# Patient Record
Sex: Male | Born: 1970 | Race: White | Hispanic: No | Marital: Single | Smoking: Never smoker
Health system: Southern US, Community
[De-identification: ages and names within clinical notes are randomized; demographics above are authoritative.]

## PROBLEM LIST (undated history)

## (undated) DIAGNOSIS — R569 Unspecified convulsions: Secondary | ICD-10-CM

## (undated) HISTORY — PX: HERNIA REPAIR: SHX51

---

## 2004-06-02 ENCOUNTER — Emergency Department (HOSPITAL_COMMUNITY): Admission: EM | Admit: 2004-06-02 | Discharge: 2004-06-02 | Payer: Self-pay | Admitting: Emergency Medicine

## 2004-08-31 ENCOUNTER — Emergency Department (HOSPITAL_COMMUNITY): Admission: EM | Admit: 2004-08-31 | Discharge: 2004-08-31 | Payer: Self-pay | Admitting: Family Medicine

## 2011-04-17 ENCOUNTER — Emergency Department (HOSPITAL_COMMUNITY)
Admission: EM | Admit: 2011-04-17 | Discharge: 2011-04-17 | Disposition: A | Discharge status: Home / Self Care | Payer: 59 | Attending: Emergency Medicine | Admitting: Emergency Medicine

## 2011-04-17 ENCOUNTER — Encounter (HOSPITAL_COMMUNITY): Payer: Self-pay

## 2011-04-17 DIAGNOSIS — G40909 Epilepsy, unspecified, not intractable, without status epilepticus: Secondary | ICD-10-CM | POA: Insufficient documentation

## 2011-04-17 DIAGNOSIS — S0003XA Contusion of scalp, initial encounter: Secondary | ICD-10-CM | POA: Insufficient documentation

## 2011-04-17 DIAGNOSIS — R51 Headache: Secondary | ICD-10-CM | POA: Insufficient documentation

## 2011-04-17 DIAGNOSIS — F29 Unspecified psychosis not due to a substance or known physiological condition: Secondary | ICD-10-CM | POA: Insufficient documentation

## 2011-04-17 DIAGNOSIS — S1093XA Contusion of unspecified part of neck, initial encounter: Secondary | ICD-10-CM | POA: Insufficient documentation

## 2011-04-17 DIAGNOSIS — W503XXA Accidental bite by another person, initial encounter: Secondary | ICD-10-CM | POA: Insufficient documentation

## 2011-04-17 DIAGNOSIS — Z79899 Other long term (current) drug therapy: Secondary | ICD-10-CM | POA: Insufficient documentation

## 2011-04-17 DIAGNOSIS — R569 Unspecified convulsions: Secondary | ICD-10-CM

## 2011-04-17 DIAGNOSIS — S0180XA Unspecified open wound of other part of head, initial encounter: Secondary | ICD-10-CM | POA: Insufficient documentation

## 2011-04-17 DIAGNOSIS — W06XXXA Fall from bed, initial encounter: Secondary | ICD-10-CM | POA: Insufficient documentation

## 2011-04-17 DIAGNOSIS — R404 Transient alteration of awareness: Secondary | ICD-10-CM | POA: Insufficient documentation

## 2011-04-17 HISTORY — DX: Unspecified convulsions: R56.9

## 2011-04-17 MED ORDER — IBUPROFEN 200 MG PO TABS
600.0000 mg | ORAL_TABLET | Freq: Once | ORAL | Status: AC
  Administered 2011-04-17: 600 mg via ORAL
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
## 2011-04-18 NOTE — ED Provider Notes (Signed)
I saw and evaluated the patient, reviewed the resident's note and I agree with the findings and plan. The patient has a history of epilepsy.  He has not been taking his valproic acid for several days.  He was drinking last night.  This morning.  He had a seizure while lying in bed.  He fell and hit his forehead before you when the ground.  He had 2 other seizures.  Later.  Afterwards he took several doses of valproic acid.  Now.  His level is therapeutic.  There is no indication of neurological deficit or altered mental status and no indication for doing a CAT scan or other testing, to the  Nicholes Stairs, MD 04/18/11 [phone]

## 2011-11-19 ENCOUNTER — Encounter (HOSPITAL_COMMUNITY): Payer: Self-pay

## 2011-11-19 ENCOUNTER — Emergency Department (HOSPITAL_COMMUNITY)
Admission: EM | Admit: 2011-11-19 | Discharge: 2011-11-19 | Disposition: A | Discharge status: Home / Self Care | Payer: 59 | Attending: Emergency Medicine | Admitting: Emergency Medicine

## 2011-11-19 DIAGNOSIS — G40909 Epilepsy, unspecified, not intractable, without status epilepticus: Secondary | ICD-10-CM

## 2011-11-19 DIAGNOSIS — R569 Unspecified convulsions: Secondary | ICD-10-CM

## 2011-11-19 DIAGNOSIS — F17200 Nicotine dependence, unspecified, uncomplicated: Secondary | ICD-10-CM | POA: Insufficient documentation

## 2011-11-19 NOTE — ED Notes (Signed)
C collar discontinued by Dr. Fonnie Jarvis.
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
## 2011-11-19 NOTE — ED Notes (Signed)
Patient was incontinent.
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
## 2011-11-30 ENCOUNTER — Emergency Department (HOSPITAL_BASED_OUTPATIENT_CLINIC_OR_DEPARTMENT_OTHER)
Admission: EM | Admit: 2011-11-30 | Discharge: 2011-11-30 | Disposition: A | Discharge status: Home / Self Care | Payer: 59 | Attending: Emergency Medicine | Admitting: Emergency Medicine

## 2011-11-30 ENCOUNTER — Encounter (HOSPITAL_BASED_OUTPATIENT_CLINIC_OR_DEPARTMENT_OTHER): Payer: Self-pay | Admitting: *Deleted

## 2011-11-30 ENCOUNTER — Emergency Department (HOSPITAL_BASED_OUTPATIENT_CLINIC_OR_DEPARTMENT_OTHER): Payer: 59

## 2011-11-30 DIAGNOSIS — T148XXA Other injury of unspecified body region, initial encounter: Secondary | ICD-10-CM

## 2011-11-30 DIAGNOSIS — G40909 Epilepsy, unspecified, not intractable, without status epilepticus: Secondary | ICD-10-CM | POA: Insufficient documentation

## 2011-11-30 DIAGNOSIS — R269 Unspecified abnormalities of gait and mobility: Secondary | ICD-10-CM | POA: Insufficient documentation

## 2011-11-30 DIAGNOSIS — T792XXA Traumatic secondary and recurrent hemorrhage and seroma, initial encounter: Secondary | ICD-10-CM | POA: Insufficient documentation

## 2011-11-30 DIAGNOSIS — M79609 Pain in unspecified limb: Secondary | ICD-10-CM | POA: Insufficient documentation

## 2011-11-30 DIAGNOSIS — S7010XA Contusion of unspecified thigh, initial encounter: Secondary | ICD-10-CM | POA: Insufficient documentation

## 2011-11-30 DIAGNOSIS — IMO0001 Reserved for inherently not codable concepts without codable children: Secondary | ICD-10-CM | POA: Insufficient documentation

## 2011-11-30 DIAGNOSIS — Z79899 Other long term (current) drug therapy: Secondary | ICD-10-CM | POA: Insufficient documentation

## 2011-11-30 DIAGNOSIS — IMO0002 Reserved for concepts with insufficient information to code with codable children: Secondary | ICD-10-CM

## 2011-11-30 MED ORDER — HYDROCODONE-ACETAMINOPHEN 5-325 MG PO TABS: 2.0000 | ORAL_TABLET | ORAL | Status: DC | PRN

## 2011-11-30 MED ORDER — NAPROXEN 500 MG PO TABS: 500.0000 mg | ORAL_TABLET | Freq: Two times a day (BID) | ORAL | Status: DC
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
## 2011-12-04 DIAGNOSIS — R569 Unspecified convulsions: Secondary | ICD-10-CM | POA: Insufficient documentation

## 2012-07-13 ENCOUNTER — Other Ambulatory Visit: Payer: Self-pay | Admitting: Neurology

## 2012-07-23 ENCOUNTER — Encounter: Payer: Self-pay | Admitting: Nurse Practitioner

## 2012-07-23 ENCOUNTER — Ambulatory Visit (INDEPENDENT_AMBULATORY_CARE_PROVIDER_SITE_OTHER): Payer: 59 | Admitting: Nurse Practitioner

## 2012-07-23 VITALS — BP 143/82 | HR 105 | Ht 74.5 in | Wt 201.0 lb

## 2012-07-23 DIAGNOSIS — G40309 Generalized idiopathic epilepsy and epileptic syndromes, not intractable, without status epilepticus: Secondary | ICD-10-CM

## 2012-07-23 DIAGNOSIS — Z5181 Encounter for therapeutic drug level monitoring: Secondary | ICD-10-CM | POA: Insufficient documentation

## 2012-07-23 DIAGNOSIS — Z79899 Other long term (current) drug therapy: Secondary | ICD-10-CM

## 2012-07-23 MED ORDER — DIVALPROEX SODIUM ER 500 MG PO TB24: 1000.0000 mg | ORAL_TABLET | Freq: Two times a day (BID) | ORAL | Status: DC

## 2012-07-23 NOTE — Patient Instructions (Addendum)
Continue Depakote at current dose, we will refill Get labs today Followup in 6 months
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
## 2012-07-23 NOTE — Progress Notes (Signed)
I have read the note, and I agree with the clinical assessment and plan.  

## 2012-07-24 LAB — COMPREHENSIVE METABOLIC PANEL
AST: 21 IU/L (ref 0–40)
Albumin: 4.9 g/dL (ref 3.5–5.5)
Alkaline Phosphatase: 48 IU/L (ref 39–117)
BUN/Creatinine Ratio: 15 (ref 9–20)
BUN: 16 mg/dL (ref 6–24)
CO2: 22 mmol/L (ref 19–28)
Chloride: 102 mmol/L (ref 97–108)
GFR calc non Af Amer: 83 mL/min/{1.73_m2} (ref >59)
Globulin, Total: 1.8 g/dL (ref 1.5–4.5)
Glucose: 89 mg/dL (ref 65–99)

## 2012-07-24 LAB — CBC WITH DIFFERENTIAL
Basophils Absolute: 0 10*3/uL (ref 0.0–0.2)
Basos: 0 % (ref 0–3)
Eos: 1 % (ref 0–5)
Lymphocytes Absolute: 2.2 10*3/uL (ref 0.7–3.1)
Lymphs: 30 % (ref 14–46)
MCHC: 34.1 g/dL (ref 31.5–35.7)
Monocytes Absolute: 0.5 10*3/uL (ref 0.1–0.9)
Neutrophils Absolute: 4.5 10*3/uL (ref 1.4–7.0)
Platelets: 192 10*3/uL (ref 155–379)

## 2013-01-23 ENCOUNTER — Encounter: Payer: Self-pay | Admitting: Nurse Practitioner

## 2013-01-23 ENCOUNTER — Ambulatory Visit (INDEPENDENT_AMBULATORY_CARE_PROVIDER_SITE_OTHER): Payer: 59 | Admitting: Nurse Practitioner

## 2013-01-23 VITALS — BP 118/70 | HR 69 | Ht 74.49 in | Wt 209.0 lb

## 2013-01-23 DIAGNOSIS — Z79899 Other long term (current) drug therapy: Secondary | ICD-10-CM

## 2013-01-23 DIAGNOSIS — G40309 Generalized idiopathic epilepsy and epileptic syndromes, not intractable, without status epilepticus: Secondary | ICD-10-CM

## 2013-01-23 MED ORDER — DIVALPROEX SODIUM ER 500 MG PO TB24: 1000.0000 mg | ORAL_TABLET | Freq: Two times a day (BID) | ORAL | Status: DC

## 2013-01-23 NOTE — Progress Notes (Signed)
[facility]  PATIENT: Jacier Gladu Lasseigne DOB: 02/23/1971   REASON FOR VISIT: Followup for seizure disorder   HISTORY OF PRESENT ILLNESS: Mr. Pound, 42 year old white male returns for followup. He has a history of seizure disorder and is currently on Depakote. Last seizure occurred 11/19/2011 he skipped a dose of medication [place] . Depakote level in the emergency room was 37. He had repeat level after being compliant with his medication and it remained at 45 so his dose was increased 2000 mg daily. He has not had further seizure activity. New complaints today of daytime drowsiness and snoring at night.    REVIEW OF SYSTEMS: Full 14 system review of systems performed and notable only for:  Constitutional: N/A  Cardiovascular: N/A  Ear/Nose/Throat: N/A  Skin: N/A  Eyes: N/A  Respiratory: N/A  Gastroitestinal: N/A  Hematology/Lymphatic: N/A  Endocrine: N/A Musculoskeletal:N/A  Allergy/Immunology: N/A  Neurological: N/A Psychiatric: N/A Sleep  snoring   ALLERGIES: No Known Allergies  HOME MEDICATIONS: Outpatient Prescriptions Prior to Visit  Medication Sig Dispense Refill  . divalproex (DEPAKOTE ER) 500 MG 24 hr tablet Take 2 tablets (1,000 mg total) by mouth 2 (two) times daily.  120 tablet  6   No facility-administered medications prior to visit.    PAST MEDICAL HISTORY: Past Medical History  Diagnosis Date  . Seizure     PAST SURGICAL HISTORY: Past Surgical History  Procedure Laterality Date  . Hernia repair      FAMILY HISTORY: Family History  Problem Relation Age of Onset  . Adopted: Yes  . Family history unknown: Yes    SOCIAL HISTORY: History   Social History  . Marital Status: Married    Spouse Name: N/A    Number of Children: N/A  . Years of Education: N/A   Occupational History  . Not on file.   Social History Main Topics  . Smoking status: Former Smoker -- 0.50 packs/day    Quit date:  12/23/2012  . Smokeless tobacco: Never Used  . Alcohol Use: Yes     Comment: occ beer once in a while. 2 cups of coffee daily.   . Drug Use: No  . Sexual Activity: Not on file   Other Topics Concern  . Not on file   Social History Narrative  . No narrative on file     PHYSICAL EXAM  Filed Vitals:   01/23/13 1506  BP: 118/70  Pulse: 69  Height: 6' 2.49" (1.892 m)  Weight: 209 lb (94.802 kg)   Body mass index is 26.48 kg/(m^2).  Generalized: Well developed, in no acute distress  Head: normocephalic and atraumatic,. Oropharynx benign  Neck: Supple, no carotid bruits neck size is 17 Cardiac: Regular rate rhythm, no murmur  Musculoskeletal: No deformity   Neurological examination   Mentation: Alert oriented to time, place, history taking. Follows all commands speech and language fluent ESS 8.  Cranial nerve II-XII: .Pupils were equal round reactive to light extraocular movements were full, visual field were full on confrontational test. Facial sensation and strength were normal. hearing was intact to finger rubbing bilaterally. Uvula tongue midline. head turning and shoulder shrug and were normal and symmetric.Tongue protrusion into cheek strength was normal. Motor: normal bulk and tone, full strength in the BUE, BLE, fine finger movements normal, no pronator drift. No focal weakness Coordination: finger-nose-finger, heel-to-shin bilaterally, no dysmetria Reflexes: Brachioradialis 2/2, biceps 2/2, triceps 2/2, patellar 2/2, Achilles 2/2, plantar responses were flexor  bilaterally. Gait and Station: Rising up from seated position without assistance, normal stance,  moderate stride, good arm swing, smooth turning, able to perform tiptoe, and heel walking without difficulty. Tandem gait steady   DIAGNOSTIC DATA (LABS, IMAGING, TESTING) - I reviewed patient records, labs, notes, testing and imaging myself where available.  Lab Results  Component Value Date   WBC 7.4 07/23/2012    HGB 14.0 07/23/2012   HCT 41.1 07/23/2012   MCV 94 07/23/2012   PLT 192 07/23/2012      Component Value Date/Time   NA 140 07/23/2012 1558   K 4.0 07/23/2012 1558   CL 102 07/23/2012 1558   CO2 22 07/23/2012 1558   GLUCOSE 89 07/23/2012 1558   BUN 16 07/23/2012 1558   CREATININE 1.08 07/23/2012 1558   CALCIUM 10.0 07/23/2012 1558   PROT 6.7 07/23/2012 1558   AST 21 07/23/2012 1558   ALT 28 07/23/2012 1558   ALKPHOS 48 07/23/2012 1558   BILITOT 0.3 07/23/2012 1558   GFRNONAA 83 07/23/2012 1558   GFRAA 96 07/23/2012 1558    ASSESSMENT AND PLAN  42 y.o. year old male  has a past medical history of Seizure. here to followup. New complaint of daytime drowsiness and snoring.ESS 8, neck 17, BMI 26.48  Will get level of Depakote today Refill meds for the next year Rx to  patient Followup yearly and when necessary Call for any seizure activity Will set up for sleep study Nilda Riggs, [facility], [facility], APRN  [facility] [address] [place], [place] [address] [phone]

## 2013-01-23 NOTE — Progress Notes (Signed)
I have read the note, and I agree with the clinical assessment and plan.  Nene Aranas KEITH   

## 2013-01-23 NOTE — Patient Instructions (Addendum)
Will get level of Depakote today Refill meds for the next year Rx to  patient Followup yearly and when necessary Call for any seizure activity Will set up for sleep study

## 2013-01-24 ENCOUNTER — Telehealth: Payer: Self-pay | Admitting: Neurology

## 2013-01-24 ENCOUNTER — Other Ambulatory Visit: Payer: Self-pay | Admitting: Nurse Practitioner

## 2013-01-24 DIAGNOSIS — R0683 Snoring: Secondary | ICD-10-CM

## 2013-01-24 DIAGNOSIS — G40909 Epilepsy, unspecified, not intractable, without status epilepticus: Secondary | ICD-10-CM

## 2013-01-24 DIAGNOSIS — R400 Somnolence: Secondary | ICD-10-CM

## 2013-01-24 NOTE — Telephone Encounter (Signed)
. °  Darrol Angel, NP is referring Gary Kennedy, 42 y.o. male, for an evaluation of sleep apnea.  Wt:  209 lbs Ht:  74 in BMI: 26.48  Diagnoses: Seizure disorder Snoring Excessive Daytime Sleepiness  Medication List: Current Outpatient Prescriptions  Medication Sig Dispense Refill   divalproex (DEPAKOTE ER) 500 MG 24 hr tablet Take 2 tablets (1,000 mg total) by mouth 2 (two) times daily.  360 tablet  3   No current facility-administered medications for this visit.    This patient presents to Darrol Angel, NP with complaint of excessive daytime sleepiness and snoring.  Pt endorses Epworth at 8; Eber Jones notes the patients neck size to be 17.  Pt is a current everyday smoker with history of seizure.   Insurance:  [employer] - prior authorization has been submitted / high deductible

## 2013-01-27 ENCOUNTER — Other Ambulatory Visit: Payer: Self-pay | Admitting: Neurology

## 2013-01-27 MED ORDER — DIVALPROEX SODIUM ER 500 MG PO TB24: 1000.0000 mg | ORAL_TABLET | Freq: Two times a day (BID) | ORAL | Status: DC

## 2013-01-27 NOTE — Sleep Study (Signed)
After reviewing the sleep study referral, I entered a split night sleep study request on this patient, as he has an underlying history of seizure disorder and if he turns out to have obstructive sleep apnea his seizures can potentially be brought on by recurrent oxygen desaturations at night. He complains of snoring and excessive daytime somnolence.   Huston Foley, MD, PhD [facility])

## 2013-01-27 NOTE — Telephone Encounter (Signed)
Entered split study request, thx s

## 2013-01-27 NOTE — Telephone Encounter (Signed)
Pt's prescription was faxed over to [facility] at [phone].

## 2013-02-01 ENCOUNTER — Encounter (INDEPENDENT_AMBULATORY_CARE_PROVIDER_SITE_OTHER): Payer: 59

## 2013-02-01 DIAGNOSIS — R400 Somnolence: Secondary | ICD-10-CM

## 2013-02-01 DIAGNOSIS — G40909 Epilepsy, unspecified, not intractable, without status epilepticus: Secondary | ICD-10-CM

## 2013-02-01 DIAGNOSIS — G479 Sleep disorder, unspecified: Secondary | ICD-10-CM

## 2013-02-01 DIAGNOSIS — G4710 Hypersomnia, unspecified: Secondary | ICD-10-CM

## 2013-02-01 DIAGNOSIS — G4761 Periodic limb movement disorder: Secondary | ICD-10-CM

## 2013-02-01 DIAGNOSIS — G4733 Obstructive sleep apnea (adult) (pediatric): Secondary | ICD-10-CM

## 2013-02-01 DIAGNOSIS — R0683 Snoring: Secondary | ICD-10-CM

## 2013-02-14 ENCOUNTER — Telehealth: Payer: Self-pay | Admitting: Neurology

## 2013-02-14 DIAGNOSIS — G4733 Obstructive sleep apnea (adult) (pediatric): Secondary | ICD-10-CM

## 2013-02-14 NOTE — Telephone Encounter (Signed)
Please call and notify patient that the recent sleep study confirmed the diagnosis of OSA, which was moderate in degree with desaturation as low as 71%. He did very well with CPAP during the study with significant improvement of the respiratory events. Therefore, I would like start the patient on CPAP at home. I placed the order in the chart.   Arrange for CPAP set up at home through a DME company of patient's choice and fax/route report to PCP and referring MD (if other than PCP).   The patient will also need a new sleep consultation appointment with me in 6-8 weeks post set up that has to be scheduled; help the patient schedule this (in a follow-up slot).   Please re-enforce the importance of compliance with treatment and the need for [place] to monitor compliance data.   Once you have spoken to the patient and scheduled the return appointment, you may close this encounter, thanks,   Huston Foley, MD, PhD [facility] ([facility])

## 2013-02-17 ENCOUNTER — Encounter: Payer: Self-pay | Admitting: *Deleted

## 2013-02-17 NOTE — Telephone Encounter (Signed)
I called and spoke with the patient about his recent sleep study results. Informed the patient that the study confirmed the diagnosis of obstructive sleep apnea and that Dr. Frances Furbish recommends he start CPAP therapy at home. Patient understood and his results will be mailed to him and a copy of the report will be sent to Darrol Angel, NP and Dr. Fortunato Curling office.

## 2014-01-23 ENCOUNTER — Ambulatory Visit: Payer: 59 | Admitting: Nurse Practitioner

## 2014-01-27 ENCOUNTER — Ambulatory Visit (INDEPENDENT_AMBULATORY_CARE_PROVIDER_SITE_OTHER): Payer: 59 | Admitting: Nurse Practitioner

## 2014-01-27 ENCOUNTER — Encounter: Payer: Self-pay | Admitting: Nurse Practitioner

## 2014-01-27 VITALS — BP 131/75 | HR 77 | Temp 98.7°F | Resp 16 | Ht 75.0 in | Wt 208.6 lb

## 2014-01-27 DIAGNOSIS — R569 Unspecified convulsions: Secondary | ICD-10-CM

## 2014-01-27 DIAGNOSIS — Z5181 Encounter for therapeutic drug level monitoring: Secondary | ICD-10-CM

## 2014-01-27 DIAGNOSIS — G40309 Generalized idiopathic epilepsy and epileptic syndromes, not intractable, without status epilepticus: Secondary | ICD-10-CM

## 2014-01-27 MED ORDER — DIVALPROEX SODIUM ER 500 MG PO TB24: 1000.0000 mg | ORAL_TABLET | Freq: Two times a day (BID) | ORAL | Status: DC

## 2014-01-27 NOTE — Patient Instructions (Addendum)
Will check labs today Continue Depakote at current dose will refill F/U yearly and prn

## 2014-01-27 NOTE — Progress Notes (Signed)
I have read the note, and I agree with the clinical assessment and plan.  WILLIS,CHARLES KEITH   

## 2014-01-27 NOTE — Progress Notes (Signed)
[facility]  PATIENT: Gary Kennedy DOB: [birth year]   REASON FOR VISIT: follow up for seizure disorder    HISTORY OF PRESENT ILLNESS:Gary Kennedy, 43 year old white male returns for followup. He has a history of seizure disorder and is currently on Depakote. Last seizure occurred 11/19/2011 when he skipped a dose of medication [place] . Depakote level in the emergency room was 37. He had repeat level after being compliant with his medication and it remained at 45 so his dose was increased 2000 mg daily. He has not had further seizure activity. Returns for reevaluation, he has no new neurologic complaints. He did not get labs after last visit.  REVIEW OF SYSTEMS: Full 14 system review of systems performed and notable only for those listed, all others are neg:  Constitutional: N/A  Cardiovascular: N/A  Ear/Nose/Throat: N/A  Skin: N/A  Eyes: N/A  Respiratory: N/A  Gastroitestinal: N/A  Hematology/Lymphatic: N/A  Endocrine: N/A Musculoskeletal:N/A  Allergy/Immunology: N/A  Neurological: N/A Psychiatric: N/A Sleep : NA   ALLERGIES: No Known Allergies  HOME MEDICATIONS: Outpatient Prescriptions Prior to Visit  Medication Sig Dispense Refill  . divalproex (DEPAKOTE ER) 500 MG 24 hr tablet Take 2 tablets (1,000 mg total) by mouth 2 (two) times daily. 360 tablet 3   No facility-administered medications prior to visit.    PAST MEDICAL HISTORY: Past Medical History  Diagnosis Date  . Seizure     PAST SURGICAL HISTORY: Past Surgical History  Procedure Laterality Date  . Hernia repair      FAMILY HISTORY: Family History  Problem Relation Age of Onset  . Adopted: Yes  . Family history unknown: Yes    SOCIAL HISTORY: History   Social History  . Marital Status: Divorced    Spouse Name: N/A    Number of Children: 2  . Years of Education: N/A   Occupational History  . Not on file.   Social History Main Topics  .  Smoking status: Former Smoker -- 0.50 packs/day    Quit date: 12/23/2012  . Smokeless tobacco: Never Used  . Alcohol Use: Yes     Comment: occ beer once in a while. 2 cups of coffee daily.   . Drug Use: No  . Sexual Activity: Not on file   Other Topics Concern  . Not on file   Social History Narrative     PHYSICAL EXAM  Filed Vitals:   01/27/14 1602  BP: 131/75  Pulse: 77  Temp: 98.7 F (37.1 C)  TempSrc: Oral  Resp: 16  Height: 6\' 3"  (1.905 m)  Weight: 208 lb 9.6 oz (94.62 kg)   Body mass index is 26.07 kg/(m^2). Generalized: Well developed, in no acute distress   Musculoskeletal: No deformity   Neurological examination   Mentation: Alert oriented to time, place, history taking. Follows all commands speech and language fluent   Cranial nerve II-XII: .Pupils were equal round reactive to light extraocular movements were full, visual field were full on confrontational test. Facial sensation and strength were normal. hearing was intact to finger rubbing bilaterally. Uvula tongue midline. head turning and shoulder shrug and were normal and symmetric.Tongue protrusion into cheek strength was normal. Motor: normal bulk and tone, full strength in the BUE, BLE, fine finger movements normal, no pronator drift. No focal weakness Coordination: finger-nose-finger, heel-to-shin bilaterally, no dysmetria Reflexes: Brachioradialis 2/2, biceps 2/2, triceps 2/2, patellar 2/2, Achilles 2/2, plantar responses were flexor bilaterally. Gait and Station: Rising  up from seated position without assistance, normal stance, moderate stride, good arm swing, smooth turning, able to perform tiptoe, and heel walking without difficulty. Tandem gait steady  DIAGNOSTIC DATA (LABS, IMAGING, TESTING) - I reviewed patient records, labs, notes, testing and imaging myself where available.  Lab Results  Component Value Date   WBC 7.4 07/23/2012   HGB 14.0 07/23/2012   HCT 41.1 07/23/2012   MCV 94  07/23/2012   PLT 192 07/23/2012      Component Value Date/Time   NA 140 07/23/2012 1558   K 4.0 07/23/2012 1558   CL 102 07/23/2012 1558   CO2 22 07/23/2012 1558   GLUCOSE 89 07/23/2012 1558   BUN 16 07/23/2012 1558   CREATININE 1.08 07/23/2012 1558   CALCIUM 10.0 07/23/2012 1558   PROT 6.7 07/23/2012 1558   AST 21 07/23/2012 1558   ALT 28 07/23/2012 1558   ALKPHOS 48 07/23/2012 1558   BILITOT 0.3 07/23/2012 1558   GFRNONAA 83 07/23/2012 1558   GFRAA 96 07/23/2012 1558    ASSESSMENT AND PLAN  43 y.o. year old male  has a past medical history of Seizure. here to follow-up. Last seizure event in 2013.  Will check labs today Continue Depakote at current dose will refill F/U yearly and prn Nilda RiggsNancy Carolyn Martin, [facility] CenterGNP, [facility] ColinasBC, APRN  [facility] [address], [address] [place], [place] [address] [phone]

## 2014-02-02 ENCOUNTER — Telehealth: Payer: Self-pay | Admitting: Nurse Practitioner

## 2014-02-02 NOTE — Telephone Encounter (Signed)
Please let patient know I have not received his labs that were ordered on his recent visit. Please have them done asap or we can send him a refusal of treatment form

## 2014-02-03 NOTE — Telephone Encounter (Signed)
I called and left a message on both phones to have pt to call back and let me know if he had gotten his labs drawn, where and when.

## 2014-02-12 NOTE — Telephone Encounter (Signed)
I called and left a message for the patient to return my call to let me know if he intends on coming back in to have lab-work drawn.
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
## 2015-01-26 ENCOUNTER — Ambulatory Visit: Payer: 59 | Admitting: Nurse Practitioner

## 2015-02-02 ENCOUNTER — Encounter: Payer: Self-pay | Admitting: Nurse Practitioner

## 2015-02-02 ENCOUNTER — Ambulatory Visit (INDEPENDENT_AMBULATORY_CARE_PROVIDER_SITE_OTHER): Payer: 59 | Admitting: Nurse Practitioner

## 2015-02-02 VITALS — BP 127/73 | HR 69 | Ht 74.5 in | Wt 209.8 lb

## 2015-02-02 DIAGNOSIS — G40309 Generalized idiopathic epilepsy and epileptic syndromes, not intractable, without status epilepticus: Secondary | ICD-10-CM

## 2015-02-02 DIAGNOSIS — Z5181 Encounter for therapeutic drug level monitoring: Secondary | ICD-10-CM | POA: Diagnosis not present

## 2015-02-02 MED ORDER — DIVALPROEX SODIUM ER 500 MG PO TB24: 1000.0000 mg | ORAL_TABLET | Freq: Two times a day (BID) | ORAL | Status: DC

## 2015-02-02 NOTE — Patient Instructions (Signed)
Will check labs today Continue Depakote at current dose will refill F/U yearly and prn 

## 2015-02-02 NOTE — Progress Notes (Signed)
[facility]  PATIENT: Roxy Filler Frommer DOB: [birth year]   REASON FOR VISIT: Follow-up for seizure disorder HISTORY FROM: Patient    HISTORY OF PRESENT ILLNESS:Mr. Malstrom, 44 year old white male returns for followup. He has a history of seizure disorder and is currently on Depakote. Last seizure occurred 11/19/2011 when he skipped a dose of medication [place] . Depakote level in the emergency room was 37. He had repeat level after being compliant with his medication and it remained at 45 so his dose was increased 2000 mg daily. He has not had further seizure activity. Returns for reevaluation, he has no new neurologic complaints. He has a history of right shoulder pain which periodically bothers him , EMG nerve conduction done 2005 was normal of both upper extremities.  He did not get labs after last two visits.   REVIEW OF SYSTEMS: Full 14 system review of systems performed and notable only for those listed, all others are neg:  Constitutional: neg  Cardiovascular: neg Ear/Nose/Throat: neg  Skin: neg Eyes: neg Respiratory: neg Gastroitestinal: neg  Hematology/Lymphatic: neg  Endocrine: neg Musculoskeletal:neg Allergy/Immunology: neg Neurological: neg Psychiatric: neg Sleep : neg   ALLERGIES: No Known Allergies  HOME MEDICATIONS: Outpatient Prescriptions Prior to Visit  Medication Sig Dispense Refill  . divalproex (DEPAKOTE ER) 500 MG 24 hr tablet Take 2 tablets (1,000 mg total) by mouth 2 (two) times daily. 360 tablet 3   No facility-administered medications prior to visit.    PAST MEDICAL HISTORY: Past Medical History  Diagnosis Date  . Seizure (HCC)     PAST SURGICAL HISTORY: Past Surgical History  Procedure Laterality Date  . Hernia repair      FAMILY HISTORY: Family History  Problem Relation Age of Onset  . Adopted: Yes  . Family history unknown: Yes    SOCIAL HISTORY: Social History   Social History    . Marital Status: Married    Spouse Name: N/A  . Number of Children: N/A  . Years of Education: N/A   Occupational History  . Not on file.   Social History Main Topics  . Smoking status: Former Smoker -- 0.50 packs/day    Quit date: 12/23/2012  . Smokeless tobacco: Never Used  . Alcohol Use: Yes     Comment: occ beer once in a while. 2 cups of coffee daily.   . Drug Use: No  . Sexual Activity: Not on file   Other Topics Concern  . Not on file   Social History Narrative     PHYSICAL EXAM  Filed Vitals:   02/02/15 1510  BP: 127/73  Pulse: 69  Height: 6' 2.5" (1.892 m)  Weight: 209 lb 12.8 oz (95.165 kg)   Body mass index is 26.58 kg/(m^2). Generalized: Well developed, in no acute distress  Musculoskeletal: No deformity  Neurological examination  Mentation: Alert oriented to time, place, history taking. Follows all commands speech and language fluent  Cranial nerve II-XII: .Pupils were equal round reactive to light extraocular movements were full, visual field were full on confrontational test. Facial sensation and strength were normal. hearing was intact to finger rubbing bilaterally. Uvula tongue midline. head turning and shoulder shrug and were normal and symmetric.Tongue protrusion into cheek strength was normal. Motor: normal bulk and tone, full strength in the BUE, BLE, fine finger movements normal, no pronator drift. No focal weakness Sensory intact to pinprick vibratory and soft touch to upper and lower extremities Coordination: finger-nose-finger, heel-to-shin bilaterally,  no dysmetria Reflexes: Brachioradialis 2/2, biceps 2/2, triceps 2/2, patellar 2/2, Achilles 2/2, plantar responses were flexor bilaterally. Gait and Station: Rising up from seated position without assistance, normal stance, moderate stride, good arm swing, smooth turning, able to perform tiptoe, and heel walking without difficulty. Tandem gait steady   DIAGNOSTIC DATA (LABS, IMAGING,  TESTING) -  ASSESSMENT AND PLAN  44 y.o. year old male  has a past medical history of Seizure (HCC). here to follow-up.The patient is a current patient of Dr. Anne HahnWillis who is out of the office today . This note is sent to the work in doctor.     PLAN: Will check labs today CBC, CMP and valproic acid level,  Continue Depakote at current dose will refill for 3 months only pharmacy is changing and he will call with new one Call for any seizure activity  F/U yearly and prn Nilda RiggsNancy Carolyn Martin, [facility] Park[facility] CampusGNP, [facility] DistrictBC, APRN  [facility] [address], [address] [place], [place] [address] [phone]

## 2015-02-03 ENCOUNTER — Telehealth: Payer: Self-pay | Admitting: *Deleted

## 2015-02-03 LAB — CBC WITH DIFFERENTIAL/PLATELET
BASOS: 0 %
Basophils Absolute: 0 10*3/uL (ref 0.0–0.2)
EOS (ABSOLUTE): 0.1 10*3/uL (ref 0.0–0.4)
EOS: 2 %
HEMOGLOBIN: 13.6 g/dL (ref 12.6–17.7)
Hematocrit: 39.9 % (ref 37.5–51.0)
IMMATURE GRANULOCYTES: 0 %
Immature Grans (Abs): 0 10*3/uL (ref 0.0–0.1)
LYMPHS ABS: 2.8 10*3/uL (ref 0.7–3.1)
Lymphs: 40 %
MCH: 31.1 pg (ref 26.6–33.0)
MCHC: 34.1 g/dL (ref 31.5–35.7)
MCV: 91 fL (ref 79–97)
MONOS ABS: 0.5 10*3/uL (ref 0.1–0.9)
Monocytes: 8 %
Neutrophils Absolute: 3.5 10*3/uL (ref 1.4–7.0)
Neutrophils: 50 %
PLATELETS: 211 10*3/uL (ref 150–379)
RBC: 4.37 x10E6/uL (ref 4.14–5.80)
RDW: 13.6 % (ref 12.3–15.4)
WBC: 6.9 10*3/uL (ref 3.4–10.8)

## 2015-02-03 LAB — COMPREHENSIVE METABOLIC PANEL
ALBUMIN: 4.5 g/dL (ref 3.5–5.5)
ALK PHOS: 57 IU/L (ref 39–117)
ALT: 27 IU/L (ref 0–44)
AST: 20 IU/L (ref 0–40)
Albumin/Globulin Ratio: 2.4 (ref 1.1–2.5)
BILIRUBIN TOTAL: 0.2 mg/dL (ref 0.0–1.2)
BUN / CREAT RATIO: 15 (ref 9–20)
BUN: 19 mg/dL (ref 6–24)
CHLORIDE: 100 mmol/L (ref 97–106)
CO2: 24 mmol/L (ref 18–29)
Calcium: 9.8 mg/dL (ref 8.7–10.2)
Creatinine, Ser: 1.31 mg/dL — ABNORMAL HIGH (ref 0.76–1.27)
GFR calc Af Amer: 75 mL/min/{1.73_m2} (ref >59)
GFR calc non Af Amer: 65 mL/min/{1.73_m2} (ref >59)
GLUCOSE: 95 mg/dL (ref 65–99)
Globulin, Total: 1.9 g/dL (ref 1.5–4.5)
Potassium: 3.9 mmol/L (ref 3.5–5.2)
Sodium: 142 mmol/L (ref 136–144)
Total Protein: 6.4 g/dL (ref 6.0–8.5)

## 2015-02-03 LAB — VALPROIC ACID LEVEL: VALPROIC ACID LVL: 73 ug/mL (ref 50–100)

## 2015-02-03 NOTE — Progress Notes (Signed)
I have reviewed and agreed above plan. 

## 2015-02-03 NOTE — Telephone Encounter (Signed)
-----   Message from Nilda RiggsNancy Carolyn Martin, NP sent at 02/03/2015  8:03 AM EST ----- CBC VPA and liver function normal. Creatinine elevated. F/U with PCP for repeat

## 2015-02-03 NOTE — Telephone Encounter (Signed)
Spoke to patient - aware of results and will follow up with PCP for his creatinine.

## 2015-04-16 ENCOUNTER — Other Ambulatory Visit: Payer: Self-pay | Admitting: Nurse Practitioner

## 2015-04-16 MED ORDER — DIVALPROEX SODIUM ER 500 MG PO TB24: 1000.0000 mg | ORAL_TABLET | Freq: Two times a day (BID) | ORAL | Status: DC

## 2015-10-12 ENCOUNTER — Other Ambulatory Visit: Payer: Self-pay | Admitting: Nurse Practitioner

## 2015-10-12 ENCOUNTER — Ambulatory Visit
Admission: RE | Admit: 2015-10-12 | Discharge: 2015-10-12 | Disposition: A | Discharge status: Home / Self Care | Payer: 59 | Source: Ambulatory Visit | Attending: Nurse Practitioner | Admitting: Nurse Practitioner

## 2015-10-12 DIAGNOSIS — R059 Cough, unspecified: Secondary | ICD-10-CM

## 2015-10-12 DIAGNOSIS — R05 Cough: Secondary | ICD-10-CM

## 2016-02-09 ENCOUNTER — Ambulatory Visit: Payer: 59 | Admitting: Nurse Practitioner

## 2016-02-10 ENCOUNTER — Encounter: Payer: Self-pay | Admitting: Nurse Practitioner

## 2016-07-11 ENCOUNTER — Ambulatory Visit (INDEPENDENT_AMBULATORY_CARE_PROVIDER_SITE_OTHER): Payer: 59 | Admitting: Nurse Practitioner

## 2016-07-11 ENCOUNTER — Encounter (INDEPENDENT_AMBULATORY_CARE_PROVIDER_SITE_OTHER): Payer: Self-pay

## 2016-07-11 ENCOUNTER — Encounter: Payer: Self-pay | Admitting: Nurse Practitioner

## 2016-07-11 VITALS — BP 125/71 | HR 79 | Ht 74.0 in | Wt 209.2 lb

## 2016-07-11 DIAGNOSIS — Z5181 Encounter for therapeutic drug level monitoring: Secondary | ICD-10-CM

## 2016-07-11 DIAGNOSIS — G40309 Generalized idiopathic epilepsy and epileptic syndromes, not intractable, without status epilepticus: Secondary | ICD-10-CM | POA: Diagnosis not present

## 2016-07-11 MED ORDER — DIVALPROEX SODIUM ER 500 MG PO TB24: 1000.0000 mg | ORAL_TABLET | Freq: Two times a day (BID) | ORAL | 3 refills | Status: DC

## 2016-07-11 NOTE — Patient Instructions (Signed)
Will check labs today CBC, CMP  Valproic acid level,  Continue Depakote at current dose will refill for 1 year Call for any seizure activity  F/U yearly and prn

## 2016-07-11 NOTE — Progress Notes (Signed)
[facility]  PATIENT: Gary Kennedy DOB: 1970/09/25   REASON FOR VISIT: Follow-up for seizure disorder HISTORY FROM: Patient    HISTORY OF PRESENT ILLNESS:Gary Kennedy, 46 year old white male returns for followup. He has a history of seizure disorder and is currently on Depakote. Last seizure occurred 11/19/2011 when he skipped a dose of medication [place] . Depakote level in the emergency room was 37. He had repeat level after being compliant with his medication and it remained at 45 so his dose was increased 2000 mg daily. He has not had further seizure activity. Returns for reevaluation, last visit here was 02/02/2015.He has no new neurologic complaints.  He returns for reevaluation   REVIEW OF SYSTEMS: Full 14 system review of systems performed and notable only for those listed, all others are neg:  Constitutional: neg  Cardiovascular: neg Ear/Nose/Throat: neg  Skin: neg Eyes: neg Respiratory: neg Gastroitestinal: neg  Hematology/Lymphatic: neg  Endocrine: neg Musculoskeletal:neg Allergy/Immunology: neg Neurological:  Seizure disorder Psychiatric: neg Sleep : neg   ALLERGIES: No Known Allergies  HOME MEDICATIONS: Outpatient Medications Prior to Visit  Medication Sig Dispense Refill  . divalproex (DEPAKOTE ER) 500 MG 24 hr tablet Take 2 tablets (1,000 mg total) by mouth 2 (two) times daily. 360 tablet 2   No facility-administered medications prior to visit.     PAST MEDICAL HISTORY: Past Medical History:  Diagnosis Date  . Seizure (HCC)     PAST SURGICAL HISTORY: Past Surgical History:  Procedure Laterality Date  . HERNIA REPAIR      FAMILY HISTORY: Family History  Problem Relation Age of Onset  . Adopted: Yes  . Family history unknown: Yes    SOCIAL HISTORY: Social History   Social History  . Marital status: Married    Spouse name: N/A  . Number of children: N/A  . Years of education: N/A    Occupational History  . Not on file.   Social History Main Topics  . Smoking status: Former Smoker    Packs/day: 0.50    Quit date: 12/23/2012  . Smokeless tobacco: Never Used  . Alcohol use Yes     Comment: occ beer once in a while. 2 cups of coffee daily.   . Drug use: No  . Sexual activity: Not on file   Other Topics Concern  . Not on file   Social History Narrative  . No narrative on file     PHYSICAL EXAM  Vitals:   07/11/16 1527  BP: 125/71  Pulse: 79  Weight: 209 lb 3.2 oz (94.9 kg)  Height:  (1.88 m)   Body mass index is 26.86 kg/m. Generalized: Well developed, in no acute distress  Musculoskeletal: No deformity  Neurological examination  Mentation: Alert oriented to time, place, history taking. Follows all commands speech and language fluent  Cranial nerve II-XII: .Pupils were equal round reactive to light extraocular movements were full, visual field were full on confrontational test. Facial sensation and strength were normal. hearing was intact to finger rubbing bilaterally. Uvula tongue midline. head turning and shoulder shrug and were normal and symmetric.Tongue protrusion into cheek strength was normal. Motor: normal bulk and tone, full strength in the BUE, BLE, fine finger movements normal, no pronator drift. No focal weakness Sensory intact to pinprick vibratory and soft touch to upper and lower extremities Coordination: finger-nose-finger, heel-to-shin bilaterally, no dysmetria Reflexes: Brachioradialis 2/2, biceps 2/2, triceps 2/2, patellar 2/2, Achilles 2/2, plantar responses were  flexor bilaterally. Gait and Station: Rising up from seated position without assistance, normal stance, moderate stride, good arm swing, smooth turning, able to perform tiptoe, and heel walking without difficulty. Tandem gait steady   DIAGNOSTIC DATA (LABS, IMAGING, TESTING) -  ASSESSMENT AND PLAN  46 y.o. year old male  has a past medical history of Seizure  (HCC). here to follow-up  for his seizure disorder   PLAN: Will check labs today CBC, CMP  to monitor adverse effects of Depakote Valproic acid level,  to monitor for therapeutic level toxicity Continue Depakote at current dose will refill for 1 year  Call for any seizure activity  F/U yearly and prn Gary Kennedy, [facility], [facility], APRN  [facility] [address], [address] [place], [place] [address] [phone]

## 2016-07-12 ENCOUNTER — Telehealth: Payer: Self-pay | Admitting: *Deleted

## 2016-07-12 LAB — CBC WITH DIFFERENTIAL/PLATELET
BASOS ABS: 0 10*3/uL (ref 0.0–0.2)
Basos: 0 %
EOS (ABSOLUTE): 0.1 10*3/uL (ref 0.0–0.4)
Eos: 1 %
Hematocrit: 40.3 % (ref 37.5–51.0)
Hemoglobin: 13.7 g/dL (ref 13.0–17.7)
IMMATURE GRANULOCYTES: 0 %
Immature Grans (Abs): 0 10*3/uL (ref 0.0–0.1)
LYMPHS ABS: 2.4 10*3/uL (ref 0.7–3.1)
Lymphs: 36 %
MCH: 30.7 pg (ref 26.6–33.0)
MCHC: 34 g/dL (ref 31.5–35.7)
MCV: 90 fL (ref 79–97)
MONOS ABS: 0.5 10*3/uL (ref 0.1–0.9)
Monocytes: 7 %
NEUTROS PCT: 56 %
Neutrophils Absolute: 3.7 10*3/uL (ref 1.4–7.0)
PLATELETS: 227 10*3/uL (ref 150–379)
RBC: 4.46 x10E6/uL (ref 4.14–5.80)
RDW: 13.3 % (ref 12.3–15.4)
WBC: 6.7 10*3/uL (ref 3.4–10.8)

## 2016-07-12 LAB — COMPREHENSIVE METABOLIC PANEL
ALBUMIN: 4.7 g/dL (ref 3.5–5.5)
ALT: 17 IU/L (ref 0–44)
AST: 16 IU/L (ref 0–40)
Albumin/Globulin Ratio: 2.4 — ABNORMAL HIGH (ref 1.2–2.2)
Alkaline Phosphatase: 52 IU/L (ref 39–117)
BILIRUBIN TOTAL: 0.3 mg/dL (ref 0.0–1.2)
BUN / CREAT RATIO: 16 (ref 9–20)
BUN: 16 mg/dL (ref 6–24)
CHLORIDE: 105 mmol/L (ref 96–106)
CO2: 26 mmol/L (ref 18–29)
Calcium: 9.7 mg/dL (ref 8.7–10.2)
Creatinine, Ser: 0.97 mg/dL (ref 0.76–1.27)
GFR calc Af Amer: 106 mL/min/{1.73_m2} (ref >59)
GFR calc non Af Amer: 92 mL/min/{1.73_m2} (ref >59)
GLOBULIN, TOTAL: 2 g/dL (ref 1.5–4.5)
Glucose: 95 mg/dL (ref 65–99)
POTASSIUM: 4.3 mmol/L (ref 3.5–5.2)
SODIUM: 147 mmol/L — AB (ref 134–144)
Total Protein: 6.7 g/dL (ref 6.0–8.5)

## 2016-07-12 LAB — VALPROIC ACID LEVEL: VALPROIC ACID LVL: 45 ug/mL — AB (ref 50–100)

## 2016-07-12 NOTE — Telephone Encounter (Signed)
Called and spoke with patient about lab results per CM,NP note. Patient verbalized understanding. Has no further questions at this time.

## 2016-07-12 NOTE — Telephone Encounter (Signed)
-----   Message from Nilda Riggs, NP sent at 07/12/2016  7:53 AM EDT ----- Labs ok continue same dose Depakote. Please call the patient

## 2016-07-17 NOTE — Progress Notes (Signed)
I have reviewed and agreed above plan. 

## 2017-07-16 NOTE — Progress Notes (Signed)
[facility]  PATIENT: Gary Kennedy DOB: Dec 23, 1970   REASON FOR VISIT: Follow-up for seizure disorder HISTORY FROM: Patient    HISTORY OF PRESENT ILLNESS:Gary Kennedy, 47 year old white male returns for followup. He has a history of seizure disorder and is currently on Depakote. Last seizure occurred 11/19/2011 when he skipped a dose of medication [place] . Depakote level in the emergency room was 37. He had repeat level after being compliant with his medication and it remained at 45 so his dose was increased 2000 mg daily. He has not had further seizure activity. Returns for reevaluation, last visit here was 07/11/16. He has no new neurologic complaints.  He returns for reevaluation   REVIEW OF SYSTEMS: Full 14 system review of systems performed and notable only for those listed, all others are neg:  Constitutional: neg  Cardiovascular: neg Ear/Nose/Throat: neg  Skin: neg Eyes: neg Respiratory: neg Gastroitestinal: neg  Hematology/Lymphatic: neg  Endocrine: neg Musculoskeletal:neg Allergy/Immunology: neg Neurological:  Seizure disorder Psychiatric: neg Sleep : neg   ALLERGIES: No Known Allergies  HOME MEDICATIONS: Outpatient Medications Prior to Visit  Medication Sig Dispense Refill  . divalproex (DEPAKOTE ER) 500 MG 24 hr tablet Take 2 tablets (1,000 mg total) by mouth 2 (two) times daily. 360 tablet 3   No facility-administered medications prior to visit.     PAST MEDICAL HISTORY: Past Medical History:  Diagnosis Date  . Seizure (HCC)     PAST SURGICAL HISTORY: Past Surgical History:  Procedure Laterality Date  . HERNIA REPAIR      FAMILY HISTORY: Family History  Adopted: Yes  Family history unknown: Yes    SOCIAL HISTORY: Social History   Socioeconomic History  . Marital status: Married    Spouse name: Not on file  . Number of children: Not on file  . Years of education: Not on file  . Highest  education level: Not on file  Occupational History  . Not on file  Social Needs  . Financial resource strain: Not on file  . Food insecurity:    Worry: Not on file    Inability: Not on file  . Transportation needs:    Medical: Not on file    Non-medical: Not on file  Tobacco Use  . Smoking status: Former Smoker    Packs/day: 0.50    Last attempt to quit: 12/23/2012    Years since quitting: 4.5  . Smokeless tobacco: Never Used  Substance and Sexual Activity  . Alcohol use: Yes    Comment: occ beer once in a while. 2 cups of coffee daily.   . Drug use: No  . Sexual activity: Not on file  Lifestyle  . Physical activity:    Days per week: Not on file    Minutes per session: Not on file  . Stress: Not on file  Relationships  . Social connections:    Talks on phone: Not on file    Gets together: Not on file    Attends religious service: Not on file    Active member of club or organization: Not on file    Attends meetings of clubs or organizations: Not on file    Relationship status: Not on file  . Intimate partner violence:    Fear of current or ex partner: Not on file    Emotionally abused: Not on file    Physically abused: Not on file    Forced sexual activity: Not on file  Other Topics Concern  . Not on file  Social History Narrative  . Not on file     PHYSICAL EXAM  Vitals:   07/17/17 1520  BP: 133/78  Pulse: 99  Weight: 216 lb 3.2 oz (98.1 kg)  Height:  (1.905 m)   Body mass index is 27.02 kg/m. Generalized: Well developed, in no acute distress  Musculoskeletal: No deformity  Neurological examination  Mentation: Alert oriented to time, place, history taking. Follows all commands speech and language fluent  Cranial nerve II-XII: .Pupils were equal round reactive to light extraocular movements were full, visual field were full on confrontational test. Facial sensation and strength were normal. hearing was intact to finger rubbing bilaterally. Uvula  tongue midline. head turning and shoulder shrug and were normal and symmetric.Tongue protrusion into cheek strength was normal. Motor: normal bulk and tone, full strength in the BUE, BLE, fine finger movements normal, no pronator drift. No focal weakness Sensory intact to  soft touch to upper and lower extremities Coordination: finger-nose-finger, heel-to-shin bilaterally, no dysmetria Reflexes: Symmetric upper and lower, plantar responses were flexor bilaterally. Gait and Station: Rising up from seated position without assistance, normal stance, moderate stride, good arm swing, smooth turning, able to perform tiptoe, and heel walking without difficulty. Tandem gait steady   DIAGNOSTIC DATA (LABS, IMAGING, TESTING) -  ASSESSMENT AND PLAN  47 y.o. year old male  has a past medical history of Seizure (HCC). here to follow-up  for his seizure disorder .  Last seizure occurred in 2013  PLAN: Will check labs today CBC, CMP  to monitor adverse effects of Depakote Valproic acid level,  to monitor for therapeutic level toxicity Continue Depakote at current dose will refill for 1 year after labs return Call for any seizure activity  F/U yearly and prn Nilda Riggs, [facility], [facility], APRN  [facility] [address] [place], [place] [address] [phone]

## 2017-07-17 ENCOUNTER — Ambulatory Visit (INDEPENDENT_AMBULATORY_CARE_PROVIDER_SITE_OTHER): Payer: 59 | Admitting: Nurse Practitioner

## 2017-07-17 ENCOUNTER — Encounter: Payer: Self-pay | Admitting: Nurse Practitioner

## 2017-07-17 VITALS — BP 133/78 | HR 99 | Ht 75.0 in | Wt 216.2 lb

## 2017-07-17 DIAGNOSIS — Z5181 Encounter for therapeutic drug level monitoring: Secondary | ICD-10-CM | POA: Diagnosis not present

## 2017-07-17 DIAGNOSIS — G40309 Generalized idiopathic epilepsy and epileptic syndromes, not intractable, without status epilepticus: Secondary | ICD-10-CM | POA: Diagnosis not present

## 2017-07-17 NOTE — Patient Instructions (Signed)
Will check labs today CBC, CMP  to monitor adverse effects of Depakote Valproic acid level,  to monitor for therapeutic level toxicity Continue Depakote at current dose will refill for 1 year  Call for any seizure activity  F/U yearly and prn

## 2017-07-18 ENCOUNTER — Other Ambulatory Visit: Payer: Self-pay | Admitting: Nurse Practitioner

## 2017-07-18 ENCOUNTER — Telehealth: Payer: Self-pay | Admitting: *Deleted

## 2017-07-18 LAB — COMPREHENSIVE METABOLIC PANEL
ALK PHOS: 50 IU/L (ref 39–117)
ALT: 28 IU/L (ref 0–44)
AST: 26 IU/L (ref 0–40)
Albumin/Globulin Ratio: 2.5 — ABNORMAL HIGH (ref 1.2–2.2)
Albumin: 4.9 g/dL (ref 3.5–5.5)
BUN/Creatinine Ratio: 16 (ref 9–20)
BUN: 18 mg/dL (ref 6–24)
Bilirubin Total: 0.3 mg/dL (ref 0.0–1.2)
CALCIUM: 9.7 mg/dL (ref 8.7–10.2)
CO2: 24 mmol/L (ref 20–29)
CREATININE: 1.1 mg/dL (ref 0.76–1.27)
Chloride: 101 mmol/L (ref 96–106)
GFR calc Af Amer: 91 mL/min/{1.73_m2} (ref >59)
GFR, EST NON AFRICAN AMERICAN: 78 mL/min/{1.73_m2} (ref >59)
GLOBULIN, TOTAL: 2 g/dL (ref 1.5–4.5)
Glucose: 83 mg/dL (ref 65–99)
POTASSIUM: 4.2 mmol/L (ref 3.5–5.2)
SODIUM: 139 mmol/L (ref 134–144)
Total Protein: 6.9 g/dL (ref 6.0–8.5)

## 2017-07-18 LAB — CBC WITH DIFFERENTIAL/PLATELET
Basophils Absolute: 0 10*3/uL (ref 0.0–0.2)
Basos: 0 %
EOS (ABSOLUTE): 0.1 10*3/uL (ref 0.0–0.4)
EOS: 1 %
Hematocrit: 41.1 % (ref 37.5–51.0)
Hemoglobin: 14.1 g/dL (ref 13.0–17.7)
IMMATURE GRANS (ABS): 0 10*3/uL (ref 0.0–0.1)
IMMATURE GRANULOCYTES: 0 %
Lymphocytes Absolute: 2.7 10*3/uL (ref 0.7–3.1)
Lymphs: 28 %
MCH: 31.6 pg (ref 26.6–33.0)
MCHC: 34.3 g/dL (ref 31.5–35.7)
MCV: 92 fL (ref 79–97)
MONOS ABS: 0.9 10*3/uL (ref 0.1–0.9)
Monocytes: 10 %
NEUTROS PCT: 61 %
Neutrophils Absolute: 6 10*3/uL (ref 1.4–7.0)
Platelets: 205 10*3/uL (ref 150–379)
RBC: 4.46 x10E6/uL (ref 4.14–5.80)
RDW: 13.2 % (ref 12.3–15.4)
WBC: 9.8 10*3/uL (ref 3.4–10.8)

## 2017-07-18 LAB — VALPROIC ACID LEVEL: Valproic Acid Lvl: 52 ug/mL (ref 50–100)

## 2017-07-18 MED ORDER — DIVALPROEX SODIUM ER 500 MG PO TB24: 1000.0000 mg | ORAL_TABLET | Freq: Two times a day (BID) | ORAL | 3 refills | Status: DC

## 2017-07-18 NOTE — Telephone Encounter (Signed)
Spoke with patient and informed him his labs look good, continue taking medication as prescribed. He verbalized understanding, appreciation.

## 2017-07-20 NOTE — Progress Notes (Signed)
I have reviewed and agreed above plan. 

## 2018-07-10 ENCOUNTER — Emergency Department (HOSPITAL_COMMUNITY): Payer: 59

## 2018-07-10 ENCOUNTER — Encounter (HOSPITAL_COMMUNITY): Payer: Self-pay | Admitting: Emergency Medicine

## 2018-07-10 ENCOUNTER — Observation Stay (HOSPITAL_COMMUNITY): Payer: 59

## 2018-07-10 ENCOUNTER — Observation Stay (HOSPITAL_COMMUNITY)
Admission: EM | Admit: 2018-07-10 | Discharge: 2018-07-11 | Disposition: A | Discharge status: Home Health | Payer: 59 | Attending: Student | Admitting: Student

## 2018-07-10 ENCOUNTER — Observation Stay (HOSPITAL_COMMUNITY): Payer: 59 | Admitting: Certified Registered Nurse Anesthetist

## 2018-07-10 ENCOUNTER — Encounter (HOSPITAL_COMMUNITY)
Admission: EM | Disposition: A | Discharge status: Home Health | Payer: Self-pay | Source: Home / Self Care | Attending: Emergency Medicine

## 2018-07-10 ENCOUNTER — Other Ambulatory Visit: Payer: Self-pay

## 2018-07-10 DIAGNOSIS — T148XXA Other injury of unspecified body region, initial encounter: Secondary | ICD-10-CM

## 2018-07-10 DIAGNOSIS — Z1159 Encounter for screening for other viral diseases: Secondary | ICD-10-CM | POA: Diagnosis not present

## 2018-07-10 DIAGNOSIS — Z791 Long term (current) use of non-steroidal anti-inflammatories (NSAID): Secondary | ICD-10-CM | POA: Diagnosis not present

## 2018-07-10 DIAGNOSIS — Z7982 Long term (current) use of aspirin: Secondary | ICD-10-CM | POA: Diagnosis not present

## 2018-07-10 DIAGNOSIS — S82201A Unspecified fracture of shaft of right tibia, initial encounter for closed fracture: Secondary | ICD-10-CM | POA: Diagnosis present

## 2018-07-10 DIAGNOSIS — G40909 Epilepsy, unspecified, not intractable, without status epilepticus: Secondary | ICD-10-CM | POA: Diagnosis not present

## 2018-07-10 DIAGNOSIS — Z419 Encounter for procedure for purposes other than remedying health state, unspecified: Secondary | ICD-10-CM

## 2018-07-10 DIAGNOSIS — S82401A Unspecified fracture of shaft of right fibula, initial encounter for closed fracture: Secondary | ICD-10-CM | POA: Diagnosis present

## 2018-07-10 DIAGNOSIS — Y92410 Unspecified street and highway as the place of occurrence of the external cause: Secondary | ICD-10-CM | POA: Insufficient documentation

## 2018-07-10 DIAGNOSIS — S82251A Displaced comminuted fracture of shaft of right tibia, initial encounter for closed fracture: Secondary | ICD-10-CM

## 2018-07-10 DIAGNOSIS — S82301A Unspecified fracture of lower end of right tibia, initial encounter for closed fracture: Secondary | ICD-10-CM | POA: Diagnosis not present

## 2018-07-10 DIAGNOSIS — S82831A Other fracture of upper and lower end of right fibula, initial encounter for closed fracture: Secondary | ICD-10-CM | POA: Diagnosis not present

## 2018-07-10 DIAGNOSIS — Z79899 Other long term (current) drug therapy: Secondary | ICD-10-CM | POA: Insufficient documentation

## 2018-07-10 DIAGNOSIS — J320 Chronic maxillary sinusitis: Secondary | ICD-10-CM | POA: Diagnosis not present

## 2018-07-10 DIAGNOSIS — R569 Unspecified convulsions: Secondary | ICD-10-CM | POA: Diagnosis not present

## 2018-07-10 HISTORY — DX: Unspecified convulsions: R56.9

## 2018-07-10 HISTORY — PX: TIBIA IM NAIL INSERTION: SHX2516

## 2018-07-10 LAB — COMPREHENSIVE METABOLIC PANEL
ALT: 32 U/L (ref 0–44)
AST: 30 U/L (ref 15–41)
Albumin: 4 g/dL (ref 3.5–5.0)
Alkaline Phosphatase: 41 U/L (ref 38–126)
Anion gap: 14 (ref 5–15)
BUN: 18 mg/dL (ref 6–20)
CO2: 18 mmol/L — ABNORMAL LOW (ref 22–32)
Calcium: 9 mg/dL (ref 8.9–10.3)
Chloride: 107 mmol/L (ref 98–111)
Creatinine, Ser: 1.12 mg/dL (ref 0.61–1.24)
GFR calc Af Amer: >60 mL/min (ref >60)
GFR calc non Af Amer: >60 mL/min (ref >60)
Glucose, Bld: 183 mg/dL — ABNORMAL HIGH (ref 70–99)
Potassium: 4 mmol/L (ref 3.5–5.1)
Sodium: 139 mmol/L (ref 135–145)
Total Bilirubin: 0.5 mg/dL (ref 0.3–1.2)
Total Protein: 6.3 g/dL — ABNORMAL LOW (ref 6.5–8.1)

## 2018-07-10 LAB — CBC
HCT: 41.4 % (ref 39.0–52.0)
Hemoglobin: 14.1 g/dL (ref 13.0–17.0)
MCH: 31.5 pg (ref 26.0–34.0)
MCHC: 34.1 g/dL (ref 30.0–36.0)
MCV: 92.4 fL (ref 80.0–100.0)
Platelets: 243 10*3/uL (ref 150–400)
RBC: 4.48 MIL/uL (ref 4.22–5.81)
RDW: 11.9 % (ref 11.5–15.5)
WBC: 10.8 10*3/uL — ABNORMAL HIGH (ref 4.0–10.5)
nRBC: 0 % (ref 0.0–0.2)

## 2018-07-10 LAB — PROTIME-INR
INR: 0.9 (ref 0.8–1.2)
Prothrombin Time: 12.3 seconds (ref 11.4–15.2)

## 2018-07-10 LAB — ETHANOL: Alcohol, Ethyl (B): <10 mg/dL (ref <10)

## 2018-07-10 LAB — LACTIC ACID, PLASMA: Lactic Acid, Venous: 4.9 mmol/L (ref 0.5–1.9)

## 2018-07-10 LAB — SAMPLE TO BLOOD BANK

## 2018-07-10 LAB — VALPROIC ACID LEVEL: Valproic Acid Lvl: 62 ug/mL (ref 50.0–100.0)

## 2018-07-10 LAB — MRSA PCR SCREENING: MRSA by PCR: NEGATIVE

## 2018-07-10 LAB — SARS CORONAVIRUS 2 BY RT PCR (HOSPITAL ORDER, PERFORMED IN ~~LOC~~ HOSPITAL LAB): SARS Coronavirus 2: NEGATIVE

## 2018-07-10 LAB — CDS SEROLOGY

## 2018-07-10 SURGERY — INSERTION, INTRAMEDULLARY ROD, TIBIA
Anesthesia: General | Laterality: Right

## 2018-07-10 MED ORDER — BACITRACIN 500 UNIT/GM EX OINT
TOPICAL_OINTMENT | CUTANEOUS | Status: DC | PRN
  Administered 2018-07-10: 1 via TOPICAL

## 2018-07-10 MED ORDER — MORPHINE SULFATE (PF) 2 MG/ML IV SOLN
2.0000 mg | INTRAVENOUS | Status: DC | PRN
  Administered 2018-07-10 – 2018-07-11 (×2): 2 mg via INTRAVENOUS
  Filled 2018-07-10 (×2): qty 1

## 2018-07-10 MED ORDER — LACTATED RINGERS IV SOLN
INTRAVENOUS | Status: DC
  Administered 2018-07-10 (×2): via INTRAVENOUS

## 2018-07-10 MED ORDER — HYDROMORPHONE HCL 1 MG/ML IJ SOLN
1.0000 mg | Freq: Once | INTRAMUSCULAR | Status: AC
  Administered 2018-07-10: 1 mg via INTRAVENOUS
  Filled 2018-07-10: qty 1

## 2018-07-10 MED ORDER — MEPERIDINE HCL 50 MG/ML IJ SOLN
6.2500 mg | INTRAMUSCULAR | Status: DC | PRN
  Administered 2018-07-10: 6.25 mg via INTRAVENOUS

## 2018-07-10 MED ORDER — CEFAZOLIN SODIUM-DEXTROSE 2-4 GM/100ML-% IV SOLN
2.0000 g | Freq: Three times a day (TID) | INTRAVENOUS | Status: AC
  Administered 2018-07-10 – 2018-07-11 (×3): 2 g via INTRAVENOUS
  Filled 2018-07-10 (×3): qty 100

## 2018-07-10 MED ORDER — POTASSIUM CHLORIDE IN NACL 20-0.9 MEQ/L-% IV SOLN
INTRAVENOUS | Status: DC
  Filled 2018-07-10: qty 1000

## 2018-07-10 MED ORDER — METOCLOPRAMIDE HCL 5 MG/ML IJ SOLN: 5.0000 mg | Freq: Three times a day (TID) | INTRAMUSCULAR | Status: DC | PRN

## 2018-07-10 MED ORDER — 0.9 % SODIUM CHLORIDE (POUR BTL) OPTIME
TOPICAL | Status: DC | PRN
  Administered 2018-07-10: 1000 mL

## 2018-07-10 MED ORDER — CEFAZOLIN SODIUM-DEXTROSE 1-4 GM/50ML-% IV SOLN: 1.0000 g | Freq: Four times a day (QID) | INTRAVENOUS | Status: DC

## 2018-07-10 MED ORDER — PROPOFOL 10 MG/ML IV BOLUS
INTRAVENOUS | Status: DC | PRN
  Administered 2018-07-10: 50 mg via INTRAVENOUS
  Administered 2018-07-10: 150 mg via INTRAVENOUS

## 2018-07-10 MED ORDER — DEXAMETHASONE SODIUM PHOSPHATE 10 MG/ML IJ SOLN
INTRAMUSCULAR | Status: AC
  Filled 2018-07-10: qty 1

## 2018-07-10 MED ORDER — DIVALPROEX SODIUM ER 500 MG PO TB24: 500.0000 mg | ORAL_TABLET | ORAL | Status: DC

## 2018-07-10 MED ORDER — MEPERIDINE HCL 50 MG/ML IJ SOLN
INTRAMUSCULAR | Status: AC
  Filled 2018-07-10: qty 1

## 2018-07-10 MED ORDER — MIDAZOLAM HCL 2 MG/2ML IJ SOLN
INTRAMUSCULAR | Status: DC | PRN
  Administered 2018-07-10: 2 mg via INTRAVENOUS

## 2018-07-10 MED ORDER — VANCOMYCIN HCL 1000 MG IV SOLR
INTRAVENOUS | Status: DC | PRN
  Administered 2018-07-10: 1000 mg

## 2018-07-10 MED ORDER — LIDOCAINE 2% (20 MG/ML) 5 ML SYRINGE
INTRAMUSCULAR | Status: DC | PRN
  Administered 2018-07-10: 80 mg via INTRAVENOUS

## 2018-07-10 MED ORDER — CEFAZOLIN SODIUM-DEXTROSE 2-3 GM-%(50ML) IV SOLR
INTRAVENOUS | Status: DC | PRN
  Administered 2018-07-10: 2 g via INTRAVENOUS

## 2018-07-10 MED ORDER — ONDANSETRON HCL 4 MG/2ML IJ SOLN
INTRAMUSCULAR | Status: DC | PRN
  Administered 2018-07-10: 4 mg via INTRAVENOUS

## 2018-07-10 MED ORDER — SUCCINYLCHOLINE CHLORIDE 200 MG/10ML IV SOSY
PREFILLED_SYRINGE | INTRAVENOUS | Status: DC | PRN
  Administered 2018-07-10: 120 mg via INTRAVENOUS

## 2018-07-10 MED ORDER — FENTANYL CITRATE (PF) 250 MCG/5ML IJ SOLN
INTRAMUSCULAR | Status: AC
  Filled 2018-07-10: qty 5

## 2018-07-10 MED ORDER — GABAPENTIN 100 MG PO CAPS
100.0000 mg | ORAL_CAPSULE | Freq: Three times a day (TID) | ORAL | Status: DC
  Administered 2018-07-10 – 2018-07-11 (×3): 100 mg via ORAL
  Filled 2018-07-10 (×3): qty 1

## 2018-07-10 MED ORDER — DIVALPROEX SODIUM ER 500 MG PO TB24: 500.0000 mg | ORAL_TABLET | Freq: Two times a day (BID) | ORAL | Status: DC

## 2018-07-10 MED ORDER — ONDANSETRON HCL 4 MG/2ML IJ SOLN: 4.0000 mg | Freq: Four times a day (QID) | INTRAMUSCULAR | Status: DC | PRN

## 2018-07-10 MED ORDER — BACITRACIN ZINC 500 UNIT/GM EX OINT
TOPICAL_OINTMENT | CUTANEOUS | Status: AC
  Filled 2018-07-10: qty 28.35

## 2018-07-10 MED ORDER — DOCUSATE SODIUM 100 MG PO CAPS
100.0000 mg | ORAL_CAPSULE | Freq: Two times a day (BID) | ORAL | Status: DC
  Administered 2018-07-11: 100 mg via ORAL
  Filled 2018-07-10: qty 1

## 2018-07-10 MED ORDER — MUPIROCIN 2 % EX OINT
1.0000 "application " | TOPICAL_OINTMENT | Freq: Two times a day (BID) | CUTANEOUS | Status: DC
  Administered 2018-07-10: 1 via NASAL
  Filled 2018-07-10: qty 22

## 2018-07-10 MED ORDER — ONDANSETRON HCL 4 MG PO TABS: 4.0000 mg | ORAL_TABLET | Freq: Four times a day (QID) | ORAL | Status: DC | PRN

## 2018-07-10 MED ORDER — IOPAMIDOL (ISOVUE-300) INJECTION 61%
100.0000 mL | Freq: Once | INTRAVENOUS | Status: AC | PRN
  Administered 2018-07-10: 100 mL via INTRAVENOUS

## 2018-07-10 MED ORDER — DEXMEDETOMIDINE HCL IN NACL 200 MCG/50ML IV SOLN
INTRAVENOUS | Status: AC
  Filled 2018-07-10: qty 50

## 2018-07-10 MED ORDER — KETAMINE HCL 50 MG/5ML IJ SOSY
50.0000 mg | PREFILLED_SYRINGE | Freq: Once | INTRAMUSCULAR | Status: AC
  Administered 2018-07-10: 50 mg via INTRAVENOUS
  Filled 2018-07-10: qty 5

## 2018-07-10 MED ORDER — ONDANSETRON HCL 4 MG/2ML IJ SOLN
INTRAMUSCULAR | Status: AC
  Filled 2018-07-10: qty 2

## 2018-07-10 MED ORDER — ENOXAPARIN SODIUM 40 MG/0.4ML ~~LOC~~ SOLN: 40.0000 mg | SUBCUTANEOUS | Status: DC

## 2018-07-10 MED ORDER — OXYCODONE HCL 5 MG/5ML PO SOLN: 5.0000 mg | Freq: Once | ORAL | Status: DC | PRN

## 2018-07-10 MED ORDER — ACETAMINOPHEN 325 MG PO TABS
650.0000 mg | ORAL_TABLET | Freq: Four times a day (QID) | ORAL | Status: DC
  Administered 2018-07-10 – 2018-07-11 (×4): 650 mg via ORAL
  Filled 2018-07-10 (×4): qty 2

## 2018-07-10 MED ORDER — HYDROMORPHONE HCL 1 MG/ML IJ SOLN
0.2500 mg | INTRAMUSCULAR | Status: DC | PRN
  Administered 2018-07-10 (×2): 0.25 mg via INTRAVENOUS
  Administered 2018-07-10: 0.5 mg via INTRAVENOUS

## 2018-07-10 MED ORDER — PROMETHAZINE HCL 25 MG/ML IJ SOLN: 6.2500 mg | INTRAMUSCULAR | Status: DC | PRN

## 2018-07-10 MED ORDER — ASPIRIN 325 MG PO TABS
325.0000 mg | ORAL_TABLET | Freq: Every day | ORAL | Status: DC
  Administered 2018-07-11: 325 mg via ORAL
  Filled 2018-07-10: qty 1

## 2018-07-10 MED ORDER — OXYCODONE HCL 5 MG PO TABS: 5.0000 mg | ORAL_TABLET | Freq: Once | ORAL | Status: DC | PRN

## 2018-07-10 MED ORDER — DIVALPROEX SODIUM ER 500 MG PO TB24
500.0000 mg | ORAL_TABLET | Freq: Every morning | ORAL | Status: DC
  Administered 2018-07-11: 500 mg via ORAL
  Filled 2018-07-10: qty 1

## 2018-07-10 MED ORDER — VANCOMYCIN HCL 1000 MG IV SOLR
INTRAVENOUS | Status: AC
  Filled 2018-07-10: qty 1000

## 2018-07-10 MED ORDER — MORPHINE SULFATE 2 MG/ML IJ SOLN
INTRAMUSCULAR | Status: AC | PRN
  Administered 2018-07-10: 4 mg via INTRAVENOUS

## 2018-07-10 MED ORDER — CHLORHEXIDINE GLUCONATE CLOTH 2 % EX PADS
6.0000 | MEDICATED_PAD | Freq: Every day | CUTANEOUS | Status: DC
  Administered 2018-07-10 – 2018-07-11 (×2): 6 via TOPICAL

## 2018-07-10 MED ORDER — SUCCINYLCHOLINE CHLORIDE 200 MG/10ML IV SOSY
PREFILLED_SYRINGE | INTRAVENOUS | Status: AC
  Filled 2018-07-10: qty 10

## 2018-07-10 MED ORDER — ONDANSETRON HCL 4 MG/2ML IJ SOLN
INTRAMUSCULAR | Status: AC
  Administered 2018-07-10: 4 mg
  Filled 2018-07-10: qty 2

## 2018-07-10 MED ORDER — VITAMIN D 25 MCG (1000 UNIT) PO TABS
4000.0000 [IU] | ORAL_TABLET | Freq: Every day | ORAL | Status: DC
  Administered 2018-07-10 – 2018-07-11 (×2): 4000 [IU] via ORAL
  Filled 2018-07-10 (×2): qty 4

## 2018-07-10 MED ORDER — DEXAMETHASONE SODIUM PHOSPHATE 10 MG/ML IJ SOLN
INTRAMUSCULAR | Status: DC | PRN
  Administered 2018-07-10: 5 mg via INTRAVENOUS

## 2018-07-10 MED ORDER — OXYCODONE HCL 5 MG PO TABS
5.0000 mg | ORAL_TABLET | ORAL | Status: DC | PRN
  Administered 2018-07-10: 10 mg via ORAL
  Administered 2018-07-11 (×2): 5 mg via ORAL
  Filled 2018-07-10: qty 1
  Filled 2018-07-10: qty 2
  Filled 2018-07-10: qty 1

## 2018-07-10 MED ORDER — METHOCARBAMOL 500 MG PO TABS: 500.0000 mg | ORAL_TABLET | Freq: Four times a day (QID) | ORAL | Status: DC | PRN

## 2018-07-10 MED ORDER — DIVALPROEX SODIUM ER 500 MG PO TB24
1000.0000 mg | ORAL_TABLET | Freq: Every day | ORAL | Status: DC
  Administered 2018-07-10: 1000 mg via ORAL
  Filled 2018-07-10: qty 2

## 2018-07-10 MED ORDER — MIDAZOLAM HCL 2 MG/2ML IJ SOLN
INTRAMUSCULAR | Status: AC
  Filled 2018-07-10: qty 2

## 2018-07-10 MED ORDER — SODIUM CHLORIDE 0.9 % IV SOLN
INTRAVENOUS | Status: DC
  Administered 2018-07-10: 17:00:00 via INTRAVENOUS

## 2018-07-10 MED ORDER — CEFAZOLIN SODIUM-DEXTROSE 2-4 GM/100ML-% IV SOLN
INTRAVENOUS | Status: AC
  Filled 2018-07-10: qty 100

## 2018-07-10 MED ORDER — HYDROMORPHONE HCL 1 MG/ML IJ SOLN
INTRAMUSCULAR | Status: AC
  Filled 2018-07-10: qty 1

## 2018-07-10 MED ORDER — METOCLOPRAMIDE HCL 10 MG PO TABS: 5.0000 mg | ORAL_TABLET | Freq: Three times a day (TID) | ORAL | Status: DC | PRN

## 2018-07-10 MED ORDER — DEXMEDETOMIDINE HCL 200 MCG/2ML IV SOLN
INTRAVENOUS | Status: DC | PRN
  Administered 2018-07-10: 4 ug via INTRAVENOUS
  Administered 2018-07-10: 8 ug via INTRAVENOUS

## 2018-07-10 MED ORDER — FENTANYL CITRATE (PF) 250 MCG/5ML IJ SOLN
INTRAMUSCULAR | Status: DC | PRN
  Administered 2018-07-10: 100 ug via INTRAVENOUS
  Administered 2018-07-10: 50 ug via INTRAVENOUS
  Administered 2018-07-10: 100 ug via INTRAVENOUS

## 2018-07-10 MED ORDER — LIDOCAINE 2% (20 MG/ML) 5 ML SYRINGE
INTRAMUSCULAR | Status: AC
  Filled 2018-07-10: qty 5

## 2018-07-10 MED ORDER — VALPROATE SODIUM 500 MG/5ML IV SOLN
1000.0000 mg | Freq: Once | INTRAVENOUS | Status: DC
  Filled 2018-07-10: qty 10

## 2018-07-10 SURGICAL SUPPLY — 56 items
BANDAGE ACE 4X5 VEL STRL LF (GAUZE/BANDAGES/DRESSINGS) ×3 IMPLANT
BANDAGE ACE 6X5 VEL STRL LF (GAUZE/BANDAGES/DRESSINGS) ×3 IMPLANT
BIT DRILL CALIBRATED 4.2 (BIT) IMPLANT
BIT DRILL SHORT 4.2 (BIT) IMPLANT
BLADE SURG 10 STRL SS (BLADE) ×6 IMPLANT
BNDG COHESIVE 4X5 TAN STRL (GAUZE/BANDAGES/DRESSINGS) ×3 IMPLANT
BNDG GAUZE ELAST 4 BULKY (GAUZE/BANDAGES/DRESSINGS) ×3 IMPLANT
BRUSH SCRUB SURG 4.25 DISP (MISCELLANEOUS) ×6 IMPLANT
CHLORAPREP W/TINT 26ML (MISCELLANEOUS) ×3 IMPLANT
COVER SURGICAL LIGHT HANDLE (MISCELLANEOUS) ×6 IMPLANT
COVER WAND RF STERILE (DRAPES) ×3 IMPLANT
DRAPE C-ARM 42X72 X-RAY (DRAPES) ×3 IMPLANT
DRAPE C-ARMOR (DRAPES) ×3 IMPLANT
DRAPE HALF SHEET 40X57 (DRAPES) ×6 IMPLANT
DRAPE IMP U-DRAPE 54X76 (DRAPES) ×6 IMPLANT
DRAPE INCISE IOBAN 66X45 STRL (DRAPES) IMPLANT
DRAPE ORTHO SPLIT 77X108 STRL (DRAPES) ×6
DRAPE SURG ORHT 6 SPLT 77X108 (DRAPES) ×2 IMPLANT
DRAPE U-SHAPE 47X51 STRL (DRAPES) ×3 IMPLANT
DRILL BIT CALIBRATED 4.2 (BIT) ×3
DRILL BIT SHORT 4.2 (BIT) ×6
DRSG ADAPTIC 3X8 NADH LF (GAUZE/BANDAGES/DRESSINGS) ×3 IMPLANT
ELECT REM PT RETURN 9FT ADLT (ELECTROSURGICAL) ×3
ELECTRODE REM PT RTRN 9FT ADLT (ELECTROSURGICAL) ×1 IMPLANT
GAUZE SPONGE 4X4 12PLY STRL (GAUZE/BANDAGES/DRESSINGS) ×3 IMPLANT
GLOVE BIO SURGEON STRL SZ 6.5 (GLOVE) ×6 IMPLANT
GLOVE BIO SURGEON STRL SZ7.5 (GLOVE) ×12 IMPLANT
GLOVE BIO SURGEONS STRL SZ 6.5 (GLOVE) ×3
GLOVE BIOGEL PI IND STRL 6.5 (GLOVE) ×1 IMPLANT
GLOVE BIOGEL PI IND STRL 7.5 (GLOVE) ×1 IMPLANT
GLOVE BIOGEL PI INDICATOR 6.5 (GLOVE) ×2
GLOVE BIOGEL PI INDICATOR 7.5 (GLOVE) ×2
GOWN STRL REUS W/ TWL LRG LVL3 (GOWN DISPOSABLE) ×2 IMPLANT
GOWN STRL REUS W/TWL LRG LVL3 (GOWN DISPOSABLE) ×6
GUIDEWIRE 3.2X400 (WIRE) ×2 IMPLANT
KIT BASIN OR (CUSTOM PROCEDURE TRAY) ×3 IMPLANT
KIT TURNOVER KIT B (KITS) ×3 IMPLANT
NAIL TIBIAL 10X375MM (Nail) ×2 IMPLANT
PACK TOTAL JOINT (CUSTOM PROCEDURE TRAY) ×3 IMPLANT
PAD ARMBOARD 7.5X6 YLW CONV (MISCELLANEOUS) ×6 IMPLANT
PAD CAST 4YDX4 CTTN HI CHSV (CAST SUPPLIES) IMPLANT
PADDING CAST COTTON 4X4 STRL (CAST SUPPLIES) ×3
PADDING CAST COTTON 6X4 STRL (CAST SUPPLIES) ×2 IMPLANT
REAMER ROD DEEP FLUTE 2.5X950 (INSTRUMENTS) ×2 IMPLANT
SCREW CANN LOCK FT STRDR 5X70 (Screw) ×2 IMPLANT
SCREW LOCK STAR 5X34 (Screw) ×2 IMPLANT
SCREW LOCK STAR 5X42 (Screw) ×4 IMPLANT
STAPLER VISISTAT 35W (STAPLE) ×3 IMPLANT
SUT MNCRL AB 3-0 PS2 18 (SUTURE) ×3 IMPLANT
SUT VIC AB 0 CT1 27 (SUTURE)
SUT VIC AB 0 CT1 27XBRD ANBCTR (SUTURE) IMPLANT
SUT VIC AB 2-0 CT1 27 (SUTURE)
SUT VIC AB 2-0 CT1 TAPERPNT 27 (SUTURE) IMPLANT
TOWEL OR 17X24 6PK STRL BLUE (TOWEL DISPOSABLE) ×3 IMPLANT
TOWEL OR 17X26 10 PK STRL BLUE (TOWEL DISPOSABLE) ×6 IMPLANT
YANKAUER SUCT BULB TIP NO VENT (SUCTIONS) IMPLANT
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
## 2018-07-10 NOTE — Anesthesia Preprocedure Evaluation (Signed)
Anesthesia Evaluation  Patient identified by MRN, date of birth, ID band Patient awake    Reviewed: Allergy & Precautions, NPO status , Patient's Chart, lab work & pertinent test results  Airway Mallampati: II  TM Distance: >3 FB Neck ROM: Full    Dental no notable dental hx.    Pulmonary neg pulmonary ROS,    Pulmonary exam normal breath sounds clear to auscultation       Cardiovascular negative cardio ROS Normal cardiovascular exam Rhythm:Regular Rate:Normal     Neuro/Psych Seizures -, Poorly Controlled,  negative psych ROS   GI/Hepatic negative GI ROS, Neg liver ROS,   Endo/Other  negative endocrine ROS  Renal/GU negative Renal ROS  negative genitourinary   Musculoskeletal negative musculoskeletal ROS (+)   Abdominal   Peds negative pediatric ROS (+)  Hematology negative hematology ROS (+)   Anesthesia Other Findings   Reproductive/Obstetrics negative OB ROS                             Anesthesia Physical Anesthesia Plan  ASA: III  Anesthesia Plan: General   Post-op Pain Management:    Induction: Intravenous  PONV Risk Score and Plan: 2 and Ondansetron, Midazolam and Treatment may vary due to age or medical condition  Airway Management Planned: Oral ETT  Additional Equipment:   Intra-op Plan:   Post-operative Plan: Extubation in OR  Informed Consent: I have reviewed the patients History and Physical, chart, labs and discussed the procedure including the risks, benefits and alternatives for the proposed anesthesia with the patient or authorized representative who has indicated his/her understanding and acceptance.     Dental advisory given  Plan Discussed with: CRNA  Anesthesia Plan Comments:         Anesthesia Quick Evaluation

## 2018-07-10 NOTE — ED Notes (Signed)
C-collar removed by Dr. Particia Nearing.

## 2018-07-10 NOTE — ED Notes (Signed)
Neurology at bedside.

## 2018-07-10 NOTE — Progress Notes (Signed)
Orthopedic Tech Progress Note Patient Details:  Gary Kennedy Apr 21, 1970 [id number] RN called requesting a short leg splint with stirrups so I applied it with fiberglass. Patient ID: Gary Kennedy, male   DOB: May 17, 1970, 48 y.o.   MRN: [id number]   [name] 07/10/2018, 7:04 AM
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
## 2018-07-10 NOTE — ED Provider Notes (Signed)
Pt signed out by Dr. Wilkie Aye pending CT results.  CT head/c-spine:  IMPRESSION:  Left maxillary sinusitis. No acute intracranial abnormality seen.    Moderate multilevel degenerative disc disease. No acute abnormality  seen in the cervical spine.      c-collar removed by me.  CT chest/abd/pelvis:  IMPRESSION:  No CT evidence of acute traumatic injury to the chest, abdomen, or  pelvis.     Pt has been seen by ortho and they will take him to the OR.  He said he had a seizure this morning, but has been compliant with his Depakote.  MS is now normal.     Jacalyn Lefevre, MD 07/10/18 [phone]

## 2018-07-10 NOTE — H&P (Signed)
Please see orthopaedic consult note for full H&P.  Roby Lofts, MD [facility] [phone] (phone) [phone] (office) [email or web address]

## 2018-07-10 NOTE — ED Notes (Signed)
While patient talking with girlfriend ortho provider came to bedside spoke with patient and girlfriend regarding plan of care for surgery.
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
## 2018-07-10 NOTE — ED Notes (Signed)
Ortho at bedside.

## 2018-07-10 NOTE — Discharge Instructions (Addendum)
Orthopaedic Trauma Service Discharge Instructions   General Discharge Instructions  WEIGHT BEARING STATUS: Weight bearing as tolerated on right leg  RANGE OF MOTION/ACTIVITY: Unrestricted knee range of motion. Okay to come out of boot for gentle ankle range of motion. No driving for at least 6 months   Wound Care: Dressing can be removed on POD #2 (Friday 07/12/18). If no drainage from incisions they can be left open to air. You can shower and get sutures wet with soap and water. See wound care instructions below  DVT/PE prophylaxis: Aspirin 325 mg daily  Diet: as you were eating previously.  Can use over the counter stool softeners and bowel preparations, such as Miralax, to help with bowel movements.  Narcotics can be constipating.  Be sure to drink plenty of fluids  PAIN MEDICATION USE AND EXPECTATIONS  You have likely been given narcotic medications to help control your pain.  After a traumatic event that results in an fracture (broken bone) with or without surgery, it is ok to use narcotic pain medications to help control one's pain.  We understand that everyone responds to pain differently and each individual patient will be evaluated on a regular basis for the continued need for narcotic medications. Ideally, narcotic medication use should last no more than 6-8 weeks (coinciding with fracture healing).   As a patient it is your responsibility as well to monitor narcotic medication use and report the amount and frequency you use these medications when you come to your office visit.   We would also advise that if you are using narcotic medications, you should take a dose prior to therapy to maximize you participation.  IF YOU ARE ON NARCOTIC MEDICATIONS IT IS NOT PERMISSIBLE TO OPERATE A MOTOR VEHICLE (MOTORCYCLE/CAR/TRUCK/MOPED) OR HEAVY MACHINERY DO NOT MIX NARCOTICS WITH OTHER CNS (CENTRAL NERVOUS SYSTEM) DEPRESSANTS SUCH AS ALCOHOL   STOP SMOKING OR USING NICOTINE PRODUCTS!!!!  As  discussed nicotine severely impairs your body's ability to heal surgical and traumatic wounds but also impairs bone healing.  Wounds and bone heal by forming microscopic blood vessels (angiogenesis) and nicotine is a vasoconstrictor (essentially, shrinks blood vessels).  Therefore, if vasoconstriction occurs to these microscopic blood vessels they essentially disappear and are unable to deliver necessary nutrients to the healing tissue.  This is one modifiable factor that you can do to dramatically increase your chances of healing your injury.    (This means no smoking, no nicotine gum, patches, etc)  DO NOT USE NONSTEROIDAL ANTI-INFLAMMATORY DRUGS (NSAID'S)  Using products such as Advil (ibuprofen), Aleve (naproxen), Motrin (ibuprofen) for additional pain control during fracture healing can delay and/or prevent the healing response.  If you would like to take over the counter (OTC) medication, Tylenol (acetaminophen) is ok.  However, some narcotic medications that are given for pain control contain acetaminophen as well. Therefore, you should not exceed more than 4000 mg of tylenol in a day if you do not have liver disease.  Also note that there are may OTC medicines, such as cold medicines and allergy medicines that my contain tylenol as well.  If you have any questions about medications and/or interactions please ask your doctor/PA or your pharmacist.      ICE AND ELEVATE INJURED/OPERATIVE EXTREMITY  Using ice and elevating the injured extremity above your heart can help with swelling and pain control.  Icing in a pulsatile fashion, such as 20 minutes on and 20 minutes off, can be followed.    Do not place ice  directly on skin. Make sure there is a barrier between to skin and the ice pack.    Using frozen items such as frozen peas works well as the conform nicely to the are that needs to be iced.  USE AN ACE WRAP OR TED HOSE FOR SWELLING CONTROL  In addition to icing and elevation, Ace wraps or TED  hose are used to help limit and resolve swelling.  It is recommended to use Ace wraps or TED hose until you are informed to stop.    When using Ace Wraps start the wrapping distally (farthest away from the body) and wrap proximally (closer to the body)   Example: If you had surgery on your leg or thing and you do not have a splint on, start the ace wrap at the toes and work your way up to the thigh        If you had surgery on your upper extremity and do not have a splint on, start the ace wrap at your fingers and work your way up to the upper arm   IF YOU ARE IN A CAM BOOT (BLACK BOOT)  You may remove boot periodically. Perform daily dressing changes as noted below.  Wash the liner of the boot regularly and wear a sock when wearing the boot. It is recommended that you sleep in the boot until told otherwise   CALL THE OFFICE WITH ANY QUESTIONS OR CONCERNS: [phone]   VISIT OUR WEBSITE FOR ADDITIONAL INFORMATION: [email or web address]    Discharge Wound Care Instructions  Do NOT apply any ointments, solutions or lotions to pin sites or surgical wounds.  These prevent needed drainage and even though solutions like hydrogen peroxide kill bacteria, they also damage cells lining the pin sites that help fight infection.  Applying lotions or ointments can keep the wounds moist and can cause them to breakdown and open up as well. This can increase the risk for infection. When in doubt call the office.  Surgical incisions should be dressed daily.  If any drainage is noted, use one layer of adaptic, then gauze, Kerlix, and an ace wrap.  Once the incision is completely dry and without drainage, it may be left open to air out.  Showering may begin 36-48 hours later.  Cleaning gently with soap and water.  Traumatic wounds should be dressed daily as well.    One layer of adaptic, gauze, Kerlix, then ace wrap.  The adaptic can be discontinued once the draining has ceased    If you have a wet to dry  dressing: wet the gauze with saline the squeeze as much saline out so the gauze is moist (not soaking wet), place moistened gauze over wound, then place a dry gauze over the moist one, followed by Kerlix wrap, then ace wrap.
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
## 2018-07-10 NOTE — Progress Notes (Signed)
Orthopedic Tech Progress Note Patient Details:  Gary Kennedy Aug 24, 1970 [id number] OR RN CALLED REQUESTING CAM WALKER Ortho Devices Type of Ortho Device: CAM walker Ortho Device/Splint Location: LRE Ortho Device/Splint Interventions: Other (comment)   Post Interventions Patient Tolerated: Other (comment) Instructions Provided: Other (comment)   Gary Kennedy Gary Kennedy 07/10/2018, 1:32 PM
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
## 2018-07-10 NOTE — Progress Notes (Signed)
Logic Hoock is a 48 y.o. male patient admitted from ED awake, alert - oriented  X 4 - no acute distress noted.  VSS - Blood pressure (!) 135/95, pulse 72, temperature (!) 97.5 F (36.4 C), temperature source Tympanic, resp. rate 16, height 6\' 3"  (1.905 m), weight 81.6 kg, SpO2 100 %.    IV in place, occlusive dsg intact without redness.  Orientation to room, and floor completed with information packet given to patient/family.  Patient declined safety video at this time.  Admission INP armband ID verified with patient/family, and in place.   SR up x 2, fall assessment complete, with patient and family able to verbalize understanding of risk associated with falls, and verbalized understanding to call nsg before up out of bed.  Call light within reach, patient able to voice, and demonstrate understanding.  Skin, clean-dry- intact without evidence of bruising, or skin tears. Right lower extremity had splint on.  No evidence of skin break down noted on exam.  Pt was on floor for 10 minutes before being taken down to OR for surgery.      Will cont to eval and treat per MD orders.  Jeralene Peters, RN 07/10/2018 11:56 AM

## 2018-07-10 NOTE — ED Notes (Signed)
[phone] pts fiance, shannon
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
## 2018-07-10 NOTE — ED Notes (Signed)
Nurse navigator spoke with patient who wanted to Facetime his girlfriend. Patient currently talking with girlfriend now.
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
## 2018-07-10 NOTE — Transfer of Care (Signed)
Immediate Anesthesia Transfer of Care Note  Patient: Gary Kennedy  Procedure(s) Performed: INTRAMEDULLARY (IM) NAIL TIBIAL (Right )  Patient Location: PACU  Anesthesia Type:General  Level of Consciousness: awake, alert  and oriented  Airway & Oxygen Therapy: Patient Spontanous Breathing and Patient connected to face mask oxygen  Post-op Assessment: Report given to RN and Post -op Vital signs reviewed and stable  Post vital signs: Reviewed and stable  Last Vitals:  Vitals Value Taken Time  BP 163/120 07/10/2018  2:13 PM  Temp    Pulse 108 07/10/2018  2:14 PM  Resp 16 07/10/2018  2:14 PM  SpO2 99 % 07/10/2018  2:14 PM  Vitals shown include unvalidated device data.  Last Pain:  Vitals:   07/10/18 1207  TempSrc:   PainSc: 2          Complications: No apparent anesthesia complications

## 2018-07-10 NOTE — Anesthesia Procedure Notes (Signed)
Procedure Name: Intubation Date/Time: 07/10/2018 12:26 PM Performed by: Mayer Camel, CRNA Pre-anesthesia Checklist: Patient identified, Emergency Drugs available, Suction available and Patient being monitored Patient Re-evaluated:Patient Re-evaluated prior to induction Oxygen Delivery Method: Circle System Utilized Preoxygenation: Pre-oxygenation with 100% oxygen Induction Type: IV induction Ventilation: Mask ventilation without difficulty Laryngoscope Size: Miller and 2 Grade View: Grade I Tube type: Oral Tube size: 7.5 mm Number of attempts: 1 Airway Equipment and Method: Stylet and Oral airway Placement Confirmation: ETT inserted through vocal cords under direct vision,  positive ETCO2 and breath sounds checked- equal and bilateral Secured at: 22 cm Tube secured with: Tape Dental Injury: Teeth and Oropharynx as per pre-operative assessment

## 2018-07-11 ENCOUNTER — Encounter (HOSPITAL_COMMUNITY): Payer: Self-pay | Admitting: Student

## 2018-07-11 DIAGNOSIS — S82201A Unspecified fracture of shaft of right tibia, initial encounter for closed fracture: Secondary | ICD-10-CM

## 2018-07-11 DIAGNOSIS — S82401A Unspecified fracture of shaft of right fibula, initial encounter for closed fracture: Secondary | ICD-10-CM

## 2018-07-11 DIAGNOSIS — R569 Unspecified convulsions: Secondary | ICD-10-CM | POA: Diagnosis not present

## 2018-07-11 LAB — AMMONIA: Ammonia: 46 umol/L — ABNORMAL HIGH (ref 9–35)

## 2018-07-11 LAB — CBC
HCT: 34.7 % — ABNORMAL LOW (ref 39.0–52.0)
Hemoglobin: 12 g/dL — ABNORMAL LOW (ref 13.0–17.0)
MCH: 31.5 pg (ref 26.0–34.0)
MCHC: 34.6 g/dL (ref 30.0–36.0)
MCV: 91.1 fL (ref 80.0–100.0)
Platelets: 210 10*3/uL (ref 150–400)
RBC: 3.81 MIL/uL — ABNORMAL LOW (ref 4.22–5.81)
RDW: 12 % (ref 11.5–15.5)
WBC: 12.1 10*3/uL — ABNORMAL HIGH (ref 4.0–10.5)
nRBC: 0 % (ref 0.0–0.2)

## 2018-07-11 LAB — VALPROIC ACID LEVEL: Valproic Acid Lvl: 66 ug/mL (ref 50.0–100.0)

## 2018-07-11 MED ORDER — DIVALPROEX SODIUM ER 500 MG PO TB24: 1000.0000 mg | ORAL_TABLET | Freq: Every day | ORAL | 0 refills | Status: DC

## 2018-07-11 MED ORDER — METHOCARBAMOL 750 MG PO TABS: 750.0000 mg | ORAL_TABLET | Freq: Four times a day (QID) | ORAL | 0 refills | Status: DC | PRN

## 2018-07-11 MED ORDER — HYDROMORPHONE HCL 1 MG/ML IJ SOLN
1.0000 mg | INTRAMUSCULAR | Status: DC | PRN
  Administered 2018-07-11 (×3): 1 mg via INTRAVENOUS
  Filled 2018-07-11 (×3): qty 1

## 2018-07-11 MED ORDER — DIVALPROEX SODIUM ER 500 MG PO TB24: 500.0000 mg | ORAL_TABLET | Freq: Every morning | ORAL | 0 refills | Status: DC

## 2018-07-11 MED ORDER — GABAPENTIN 100 MG PO CAPS: 100.0000 mg | ORAL_CAPSULE | Freq: Three times a day (TID) | ORAL | 0 refills | Status: DC

## 2018-07-11 MED ORDER — ACETAMINOPHEN 325 MG PO TABS: 500.0000 mg | ORAL_TABLET | Freq: Two times a day (BID) | ORAL | 0 refills | Status: DC

## 2018-07-11 MED ORDER — ASPIRIN 325 MG PO TABS: 325.0000 mg | ORAL_TABLET | Freq: Every day | ORAL | 0 refills | Status: DC

## 2018-07-11 MED ORDER — OXYCODONE-ACETAMINOPHEN 5-325 MG PO TABS: 1.0000 | ORAL_TABLET | ORAL | 0 refills | Status: DC | PRN
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
## 2018-07-11 NOTE — Progress Notes (Signed)
Reason for consult:   Subjective: Doing well postop.  No further seizures.   ROS: negative except above   Examination  Vital signs in last 24 hours: Temp:  [97.5 F (36.4 C)-98.9 F (37.2 C)] 97.7 F (36.5 C) (04/30 1409) Pulse Rate:  [73-96] 79 (04/30 1409) Resp:  [12-20] 16 (04/29 1600) BP: (135-163)/(79-109) 147/89 (04/30 1409) SpO2:  [93 %-99 %] 96 % (04/30 1409)  General: lying in bed CVS: pulse-normal rate and rhythm RS: breathing comfortably Extremities: normal   Neuro: MS: Alert, oriented, follows commands CN: pupils equal and reactive,  EOMI, face symmetric, tongue midline, normal sensation over face, Motor: 5/5 strength in B/L UE, L LE, cast in RLE  Coordination: normal Gait: not tested  Basic Metabolic Panel: Recent Labs  Lab 07/10/18 0621  NA 139  K 4.0  CL 107  CO2 18*  GLUCOSE 183*  BUN 18  CREATININE 1.12  CALCIUM 9.0    CBC: Recent Labs  Lab 07/10/18 0621 07/11/18 0309  WBC 10.8* 12.1*  HGB 14.1 12.0*  HCT 41.4 34.7*  MCV 92.4 91.1  PLT 243 210     Coagulation Studies: Recent Labs    07/10/18 0621  LABPROT 12.3  INR 0.9       ASSESSMENT AND PLAN  Breakthrough seizures History of epilepsy  Repeat valproic acid levels 66 Ammonia 46 Discharged on increased dose of Depakote ER 500 mg in the morning and 1000 milligrams at night. Counseled on seizure precautions with no driving for 6 months, not working with heavy machinery until seizure free for 6 months.  F/U with Dr. Anne Hahn for seizure management.   Georgiana Spinner Caleyah Jr [facility] Pager Number [phone] For questions after 7pm please refer to AMION to reach the Neurologist on call
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
## 2018-07-11 NOTE — Progress Notes (Signed)
Gary Kennedy to be D/C'd home per MD order. Discussed with the patient and all questions fully answered.   VVS, Skin clean, dry and intact without evidence of skin break down, no evidence of skin tears noted.  IV catheter discontinued intact. Site without signs and symptoms of complications. Dressing and pressure applied.  An After Visit Summary was printed and given to the patient.  Patient escorted via WC, and D/C home via private auto.  Jeralene Peters  07/11/2018 3:31 PM
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
## 2018-07-12 ENCOUNTER — Telehealth: Payer: Self-pay | Admitting: Neurology

## 2018-07-12 ENCOUNTER — Encounter: Payer: Self-pay | Admitting: Nurse Practitioner
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
## 2018-07-12 NOTE — Anesthesia Postprocedure Evaluation (Signed)
Anesthesia Post Note  Patient: Gary Kennedy  Procedure(s) Performed: INTRAMEDULLARY (IM) NAIL TIBIAL (Right )     Patient location during evaluation: PACU Anesthesia Type: General Level of consciousness: awake and alert Pain management: pain level controlled Vital Signs Assessment: post-procedure vital signs reviewed and stable Respiratory status: spontaneous breathing, nonlabored ventilation and respiratory function stable Cardiovascular status: blood pressure returned to baseline and stable Postop Assessment: no apparent nausea or vomiting Anesthetic complications: no    Last Vitals:  Vitals:   07/11/18 0336 07/11/18 1409  BP: 139/83 (!) 147/89  Pulse: 78 79  Resp:    Temp: 36.8 C 36.5 C  SpO2: 95% 96%    Last Pain:  Vitals:   07/11/18 1409  TempSrc: Oral  PainSc:    Pain Goal: Patients Stated Pain Goal: 3 (07/11/18 1228)                 Lowella Curb

## 2018-07-15 NOTE — Telephone Encounter (Signed)
Received skype message from [facility], NP stating she discussed with Dr. Anne Hahn and he would prefer pt to be scheduled with him for a virtual video visit. Dr. Anne Hahn has not had an appt with the pt since 2018.  I contacted the pt and he consented to a video visit with Dr. Anne Hahn for 07/16/18 at 330 pm.   Pt understands that although there may be some limitations with this type of visit, we will take all precautions to reduce any security or privacy concerns.  Pt understands that this will be treated like an in office visit and we will file with pt's insurance, and there may be a patient responsible charge related to this service.  Pt's e-mail is [email or web address] and mobile # is [phone]. Link for [email or web address] visit has been submitted to both e-mail and mobile #.   Pt's allergies, meds and PMH have been updated.  Pt is following up from ED visit on 07/10/18 and recent seizure activity .

## 2018-07-15 NOTE — Telephone Encounter (Signed)
This pt is scheduled to see NP SS on 07/23/18. Will fwd to her CMA so they can discuss appt.

## 2018-07-16 ENCOUNTER — Ambulatory Visit (INDEPENDENT_AMBULATORY_CARE_PROVIDER_SITE_OTHER): Payer: 59 | Admitting: Neurology

## 2018-07-16 ENCOUNTER — Encounter: Payer: Self-pay | Admitting: Neurology

## 2018-07-16 ENCOUNTER — Other Ambulatory Visit: Payer: Self-pay

## 2018-07-16 DIAGNOSIS — G40309 Generalized idiopathic epilepsy and epileptic syndromes, not intractable, without status epilepticus: Secondary | ICD-10-CM

## 2018-07-16 MED ORDER — DIVALPROEX SODIUM ER 500 MG PO TB24: ORAL_TABLET | ORAL | 3 refills | Status: DC
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
## 2018-07-23 ENCOUNTER — Ambulatory Visit: Payer: 59 | Admitting: Neurology

## 2018-07-23 ENCOUNTER — Ambulatory Visit: Payer: 59 | Admitting: Nurse Practitioner

## 2018-08-14 ENCOUNTER — Telehealth: Payer: Self-pay

## 2018-08-14 DIAGNOSIS — Z0289 Encounter for other administrative examinations: Secondary | ICD-10-CM

## 2018-08-14 NOTE — Telephone Encounter (Signed)
DMV paper work has been completed/signed by Dr. Willis. Paper work fwd back to medical records for processing.  

## 2018-08-15 ENCOUNTER — Telehealth: Payer: Self-pay | Admitting: *Deleted

## 2018-08-15 NOTE — Telephone Encounter (Signed)
I faxed pt DMV form on 08/15/18. [phone] 

## 2018-08-28 ENCOUNTER — Telehealth: Payer: Self-pay | Admitting: Neurology

## 2018-08-28 DIAGNOSIS — Z5181 Encounter for therapeutic drug level monitoring: Secondary | ICD-10-CM

## 2018-08-28 NOTE — Telephone Encounter (Signed)
I called the patient.  The patient is to come in to get blood work done on his Depakote, we would like to see the levels in the 110 range.  The orders for blood work have been placed.

## 2018-09-03 ENCOUNTER — Other Ambulatory Visit: Payer: Self-pay

## 2018-09-03 ENCOUNTER — Other Ambulatory Visit (INDEPENDENT_AMBULATORY_CARE_PROVIDER_SITE_OTHER): Payer: Self-pay

## 2018-09-03 DIAGNOSIS — Z0289 Encounter for other administrative examinations: Secondary | ICD-10-CM

## 2018-09-03 DIAGNOSIS — Z5181 Encounter for therapeutic drug level monitoring: Secondary | ICD-10-CM

## 2018-09-04 ENCOUNTER — Telehealth: Payer: Self-pay

## 2018-09-04 LAB — COMPREHENSIVE METABOLIC PANEL
ALT: 36 IU/L (ref 0–44)
AST: 20 IU/L (ref 0–40)
Albumin/Globulin Ratio: 2.2 (ref 1.2–2.2)
Albumin: 4.7 g/dL (ref 4.0–5.0)
Alkaline Phosphatase: 64 IU/L (ref 39–117)
BUN/Creatinine Ratio: 20 (ref 9–20)
BUN: 19 mg/dL (ref 6–24)
Bilirubin Total: 0.2 mg/dL (ref 0.0–1.2)
CO2: 24 mmol/L (ref 20–29)
Calcium: 10 mg/dL (ref 8.7–10.2)
Chloride: 100 mmol/L (ref 96–106)
Creatinine, Ser: 0.97 mg/dL (ref 0.76–1.27)
GFR calc Af Amer: 105 mL/min/{1.73_m2} (ref >59)
GFR calc non Af Amer: 91 mL/min/{1.73_m2} (ref >59)
Globulin, Total: 2.1 g/dL (ref 1.5–4.5)
Glucose: 97 mg/dL (ref 65–99)
Potassium: 4.9 mmol/L (ref 3.5–5.2)
Sodium: 140 mmol/L (ref 134–144)
Total Protein: 6.8 g/dL (ref 6.0–8.5)

## 2018-09-04 LAB — CBC WITH DIFFERENTIAL/PLATELET
Basophils Absolute: 0.1 10*3/uL (ref 0.0–0.2)
Basos: 1 %
EOS (ABSOLUTE): 0.2 10*3/uL (ref 0.0–0.4)
Eos: 3 %
Hematocrit: 40.9 % (ref 37.5–51.0)
Hemoglobin: 13.7 g/dL (ref 13.0–17.7)
Immature Grans (Abs): 0.1 10*3/uL (ref 0.0–0.1)
Immature Granulocytes: 1 %
Lymphocytes Absolute: 2.2 10*3/uL (ref 0.7–3.1)
Lymphs: 32 %
MCH: 30.9 pg (ref 26.6–33.0)
MCHC: 33.5 g/dL (ref 31.5–35.7)
MCV: 92 fL (ref 79–97)
Monocytes Absolute: 0.7 10*3/uL (ref 0.1–0.9)
Monocytes: 10 %
Neutrophils Absolute: 3.6 10*3/uL (ref 1.4–7.0)
Neutrophils: 53 %
Platelets: 249 10*3/uL (ref 150–450)
RBC: 4.44 x10E6/uL (ref 4.14–5.80)
RDW: 13.4 % (ref 11.6–15.4)
WBC: 6.7 10*3/uL (ref 3.4–10.8)

## 2018-09-04 LAB — VALPROIC ACID LEVEL: Valproic Acid Lvl: 91 ug/mL (ref 50–100)

## 2018-09-04 MED ORDER — DIVALPROEX SODIUM ER 500 MG PO TB24: ORAL_TABLET | ORAL | 3 refills | Status: DC

## 2018-09-04 NOTE — Telephone Encounter (Signed)
I reached out to the pt and reviewed results.  Pt verbalized understanding on the results, but states he is only taking 2500 mg total for Depakote daily.  Pt states he is currently taking 500 mg 3 tabs in the am and 2 in the pm. I reviewed with the pt based of Dr. Jannifer Franklin last note from 07/16/18 he was to take the dosage reported for 2 weeks then start taking 1500 mg bid.  Pt states he was not aware of this. I advised I would fwd a message to MD to confirm what dosage he should be on. Pt was agreeable

## 2018-09-04 NOTE — Addendum Note (Signed)
Addended by: Kathrynn Ducking on: 09/04/2018 03:28 PM   Modules accepted: Orders

## 2018-09-04 NOTE — Telephone Encounter (Signed)
-----   Message from Kathrynn Ducking, MD sent at 09/04/2018  9:42 AM EDT ----- Blood work is unremarkable, Depakote level has significantly improved, now 91, patient is on Depakote 1500 mg twice daily, will not adjust dose at this time, but may have room to increase slightly in the future if needed.  Please call the patient. ----- Message ----- From: Lavone Neri Lab Results In Sent: 09/04/2018   7:37 AM EDT To: Kathrynn Ducking, MD

## 2018-09-04 NOTE — Telephone Encounter (Signed)
The patient apparently is only been taking 5 tablets of Depakote daily instead of 6, he is running adequate blood levels currently, we will leave him at current dose, we do have room to increase however if need be.

## 2018-09-19 ENCOUNTER — Other Ambulatory Visit: Payer: Self-pay

## 2018-09-19 MED ORDER — DIVALPROEX SODIUM ER 500 MG PO TB24: ORAL_TABLET | ORAL | 1 refills | Status: DC
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
## 2018-11-19 ENCOUNTER — Ambulatory Visit (INDEPENDENT_AMBULATORY_CARE_PROVIDER_SITE_OTHER): Payer: 59 | Admitting: Neurology

## 2018-11-19 ENCOUNTER — Other Ambulatory Visit: Payer: Self-pay

## 2018-11-19 ENCOUNTER — Encounter: Payer: Self-pay | Admitting: Neurology

## 2018-11-19 VITALS — BP 146/86 | HR 53 | Temp 98.0°F | Ht 75.0 in | Wt 217.6 lb

## 2018-11-19 DIAGNOSIS — G40309 Generalized idiopathic epilepsy and epileptic syndromes, not intractable, without status epilepticus: Secondary | ICD-10-CM | POA: Diagnosis not present

## 2018-11-19 DIAGNOSIS — R569 Unspecified convulsions: Secondary | ICD-10-CM

## 2018-11-19 NOTE — Patient Instructions (Signed)
Please continue taking Depakote at current dose. Do not miss any doses.

## 2018-11-19 NOTE — Progress Notes (Signed)
I have read the note, and I agree with the clinical assessment and plan.  Charles K Willis   

## 2018-11-20 ENCOUNTER — Encounter: Payer: Self-pay | Admitting: *Deleted

## 2018-11-20 ENCOUNTER — Telehealth: Payer: Self-pay | Admitting: *Deleted

## 2018-11-20 DIAGNOSIS — Z0289 Encounter for other administrative examinations: Secondary | ICD-10-CM

## 2018-11-20 LAB — VALPROIC ACID LEVEL: Valproic Acid Lvl: 95 ug/mL (ref 50–100)

## 2018-11-20 NOTE — Telephone Encounter (Signed)
LMVM for pt to return call for lab results.  

## 2018-11-20 NOTE — Telephone Encounter (Signed)
This opened in error

## 2018-11-20 NOTE — Telephone Encounter (Signed)
-----   Message from Suzzanne Cloud, NP sent at 11/20/2018  7:52 AM EDT ----- Please call the patient. Good level of Depakote. Will not adjust, continue at current dose.

## 2018-11-20 NOTE — Telephone Encounter (Signed)
Spoke to pt and relayed that depakote level was normal.  No change in dose.  He verbalized understanding.

## 2018-12-23 ENCOUNTER — Telehealth: Payer: Self-pay | Admitting: Neurology

## 2018-12-23 NOTE — Telephone Encounter (Signed)
Spoke with the patient and he stated that he will need a note to return to work on 01-09-2019. He stated that it will be the 6 month mark of him being seizure free. He would like to know can the letter be emailed to him. E-mail listed below.   [email or web address]

## 2018-12-23 NOTE — Telephone Encounter (Signed)
Phone rep checked office voicemail; pt left voicemail asking for a return to work note for 10-29.  Pt is asking for a call on this request. this voicemail was left @10 :05 this morning.
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
## 2019-01-07 ENCOUNTER — Telehealth: Payer: Self-pay | Admitting: *Deleted

## 2019-01-07 NOTE — Telephone Encounter (Signed)
DMV form completed. To SS/NP for review and signature.

## 2019-01-08 NOTE — Telephone Encounter (Signed)
I have signed the DMV form.

## 2019-01-09 ENCOUNTER — Telehealth: Payer: Self-pay | Admitting: *Deleted

## 2019-01-09 NOTE — Telephone Encounter (Signed)
Form reviewed, completed, signed. To MR.

## 2019-01-09 NOTE — Telephone Encounter (Signed)
Pt DMV fom faxed on 01/09/19 [facility].
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
## 2019-05-21 ENCOUNTER — Other Ambulatory Visit: Payer: Self-pay

## 2019-05-21 ENCOUNTER — Encounter: Payer: Self-pay | Admitting: Neurology

## 2019-05-21 ENCOUNTER — Ambulatory Visit (INDEPENDENT_AMBULATORY_CARE_PROVIDER_SITE_OTHER): Payer: 59 | Admitting: Neurology

## 2019-05-21 VITALS — BP 164/95 | HR 107 | Temp 97.6°F | Ht 75.0 in | Wt 215.4 lb

## 2019-05-21 DIAGNOSIS — G40309 Generalized idiopathic epilepsy and epileptic syndromes, not intractable, without status epilepticus: Secondary | ICD-10-CM

## 2019-05-21 MED ORDER — DIVALPROEX SODIUM ER 500 MG PO TB24: ORAL_TABLET | ORAL | 3 refills | Status: DC

## 2019-05-21 NOTE — Patient Instructions (Signed)
Drink plenty of water, stay well hydrated  Continue taking Depakote as prescribed  Get your physical next week Check blood work today  See you back in 6 months

## 2019-05-22 LAB — COMPREHENSIVE METABOLIC PANEL
ALT: 16 IU/L (ref 0–44)
AST: 17 IU/L (ref 0–40)
Albumin/Globulin Ratio: 2.3 — ABNORMAL HIGH (ref 1.2–2.2)
Albumin: 4.9 g/dL (ref 3.8–4.9)
Alkaline Phosphatase: 58 IU/L (ref 39–117)
BUN/Creatinine Ratio: 16 (ref 9–20)
BUN: 14 mg/dL (ref 6–24)
Bilirubin Total: 0.3 mg/dL (ref 0.0–1.2)
CO2: 24 mmol/L (ref 20–29)
Calcium: 9.8 mg/dL (ref 8.7–10.2)
Chloride: 100 mmol/L (ref 96–106)
Creatinine, Ser: 0.88 mg/dL (ref 0.76–1.27)
GFR calc Af Amer: 115 mL/min/{1.73_m2} (ref >59)
GFR calc non Af Amer: 99 mL/min/{1.73_m2} (ref >59)
Globulin, Total: 2.1 g/dL (ref 1.5–4.5)
Glucose: 98 mg/dL (ref 65–99)
Potassium: 4.7 mmol/L (ref 3.5–5.2)
Sodium: 141 mmol/L (ref 134–144)
Total Protein: 7 g/dL (ref 6.0–8.5)

## 2019-05-22 LAB — CBC WITH DIFFERENTIAL/PLATELET
Basophils Absolute: 0 10*3/uL (ref 0.0–0.2)
Basos: 1 %
EOS (ABSOLUTE): 0 10*3/uL (ref 0.0–0.4)
Eos: 1 %
Hematocrit: 41.8 % (ref 37.5–51.0)
Hemoglobin: 14.3 g/dL (ref 13.0–17.7)
Immature Grans (Abs): 0 10*3/uL (ref 0.0–0.1)
Immature Granulocytes: 1 %
Lymphocytes Absolute: 2.3 10*3/uL (ref 0.7–3.1)
Lymphs: 29 %
MCH: 32.2 pg (ref 26.6–33.0)
MCHC: 34.2 g/dL (ref 31.5–35.7)
MCV: 94 fL (ref 79–97)
Monocytes Absolute: 0.6 10*3/uL (ref 0.1–0.9)
Monocytes: 8 %
Neutrophils Absolute: 5 10*3/uL (ref 1.4–7.0)
Neutrophils: 60 %
Platelets: 208 10*3/uL (ref 150–450)
RBC: 4.44 x10E6/uL (ref 4.14–5.80)
RDW: 12.6 % (ref 11.6–15.4)
WBC: 8.1 10*3/uL (ref 3.4–10.8)

## 2019-05-22 LAB — CK: Total CK: 175 U/L (ref 41–331)

## 2019-05-22 LAB — VALPROIC ACID LEVEL: Valproic Acid Lvl: 83 ug/mL (ref 50–100)

## 2019-05-22 LAB — AMMONIA: Ammonia: 96 ug/dL (ref 40–200)

## 2019-05-22 NOTE — Progress Notes (Signed)
I have read the note, and I agree with the clinical assessment and plan.  Gary Kennedy K Gary Kennedy   

## 2019-05-26 ENCOUNTER — Telehealth: Payer: Self-pay | Admitting: *Deleted

## 2019-05-26 NOTE — Telephone Encounter (Signed)
I spoke to pt and relayed that lab results unremarkable, VPA therapeutic.  She verbalized understanding.

## 2019-05-26 NOTE — Telephone Encounter (Signed)
-----   Message from Glean Salvo, NP sent at 05/26/2019  6:14 AM EDT ----- Please call patient.  Blood work was unremarkable.  Depakote was therapeutic.

## 2019-08-23 ENCOUNTER — Telehealth: Payer: Self-pay | Admitting: Neurology

## 2019-08-23 MED ORDER — DIVALPROEX SODIUM ER 500 MG PO TB24: ORAL_TABLET | ORAL | 3 refills | Status: DC

## 2019-08-23 NOTE — Telephone Encounter (Signed)
I called the patient.  The patient likely had a seizure this morning, he missed a 1500 mg Depakote dose last evening, and had a seizure before he took his medication this morning.  I have indicated that he is not to operate a motor vehicle for least 3 months, he claims that he has never had a seizure on brand-name Depakote and wants to go back on this.  I will go ahead and send in a prescription for brand-name Depakote, we will check the blood work again in 3 weeks.

## 2019-08-25 NOTE — Telephone Encounter (Signed)
Pt called wanting to know if the PA from was received for his Depakote (brand name) Please advise.

## 2019-08-25 NOTE — Telephone Encounter (Signed)
I called PA department at [facility] at [phone]. I stated pt was change to brand name divalproex (DEPAKOTE ER) 500 MG 24 hr tablet because of recent seizure on generic form. I stated pt was on brand form before and never had a seizure. She stated medication is on non formulary list. PA was done via phone. [id number]. Decision within 24 to 72 hours.

## 2019-08-26 ENCOUNTER — Other Ambulatory Visit: Payer: Self-pay

## 2019-08-26 ENCOUNTER — Telehealth: Payer: Self-pay | Admitting: Neurology

## 2019-08-26 MED ORDER — DIVALPROEX SODIUM ER 500 MG PO TB24: 500.0000 mg | ORAL_TABLET | Freq: Every day | ORAL | 0 refills | Status: DC

## 2019-08-26 MED ORDER — DIVALPROEX SODIUM ER 500 MG PO TB24: ORAL_TABLET | ORAL | 1 refills | Status: DC

## 2019-08-26 NOTE — Telephone Encounter (Signed)
Receive fax that PA was approve for brand divalproex (DEPAKOTE ER) 500 MG 24 hr tablet from 08/25/2019 to 08/24/2020 from [facility]. Case number is [id number].

## 2019-08-26 NOTE — Telephone Encounter (Signed)
Pt has called to report that while he waits for the brand name version of divalproex (DEPAKOTE ER) 500 MG 24 hr tablet  To come through mail order he will need about 2 weeks worth of the generic sent to [facility], [facility].

## 2019-08-26 NOTE — Telephone Encounter (Signed)
I called pt that generic depakote was sent per verbal order from Dr Pearlean Brownie for two weeks. The brand mail order was sent to [facility]. PT will call mail order of price and delivery date. He appreciate the call.

## 2019-08-26 NOTE — Progress Notes (Signed)
Two weeks of depakote sent to [facility] until mail order comes in.

## 2019-08-26 NOTE — Telephone Encounter (Signed)
I called pt that the brand name divalproex was approve for a year and sent to mail order pharmacy.PT requested it be sent to [facility] for 3 month supply. Pt will call pharmacy to find out what the brand name price will be and verbalized understanding.
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
## 2019-09-15 ENCOUNTER — Telehealth: Payer: Self-pay | Admitting: Neurology

## 2019-09-15 DIAGNOSIS — Z5181 Encounter for therapeutic drug level monitoring: Secondary | ICD-10-CM

## 2019-09-15 NOTE — Telephone Encounter (Signed)
I called the patient.  He is to come in and get blood work on brand-name Depakote.

## 2019-09-18 ENCOUNTER — Other Ambulatory Visit: Payer: Self-pay

## 2019-09-18 ENCOUNTER — Telehealth: Payer: Self-pay | Admitting: Neurology

## 2019-09-18 ENCOUNTER — Other Ambulatory Visit (INDEPENDENT_AMBULATORY_CARE_PROVIDER_SITE_OTHER): Payer: Self-pay

## 2019-09-18 DIAGNOSIS — Z5181 Encounter for therapeutic drug level monitoring: Secondary | ICD-10-CM

## 2019-09-18 DIAGNOSIS — Z0289 Encounter for other administrative examinations: Secondary | ICD-10-CM

## 2019-09-18 NOTE — Telephone Encounter (Signed)
I called patient.  He came in for blood work this morning, he claims that his insurance did not approve brand-name Depakote.  Is still on the generic, he will get his levels checked again.  He had a seizure on 23 August 2019 after he missed a dose of medication.  Is not to drive for 3 months.  He will get a disability form from his employer for [place] to fill out.

## 2019-09-19 LAB — VALPROIC ACID LEVEL: Valproic Acid Lvl: 110 ug/mL — ABNORMAL HIGH (ref 50–100)

## 2019-09-22 ENCOUNTER — Telehealth: Payer: Self-pay

## 2019-09-22 NOTE — Telephone Encounter (Signed)
-----   Message from York Spaniel, MD sent at 09/19/2019  7:47 AM EDT ----- Excellent random level of Depakote, no change in dosing recommended.  Please call the patient. ----- Message ----- From: Nell Range Lab Results In Sent: 09/19/2019   7:37 AM EDT To: York Spaniel, MD

## 2019-09-22 NOTE — Telephone Encounter (Signed)
I called pt that the lab work was excellent per Dr Anne Hahn no change in medication. NO change in depakote medication. Pt verbalized understanding.

## 2019-10-02 ENCOUNTER — Telehealth: Payer: Self-pay | Admitting: *Deleted

## 2019-10-02 NOTE — Telephone Encounter (Signed)
Received [employer] FMLA form at desk 10-01-19.  I called pt to see what he needed this for.  He stated had seizure 08-23-19 per Dr. Anne Hahn cannot drive for 3 months.  I completed partial, to Dr. Anne Hahn for completion and signature.

## 2019-10-02 NOTE — Telephone Encounter (Signed)
The form has been signed. 

## 2019-10-08 ENCOUNTER — Telehealth: Payer: Self-pay | Admitting: *Deleted

## 2019-10-08 NOTE — Telephone Encounter (Signed)
Form faxed to [employer] on 10/07/19 [phone]

## 2019-10-13 ENCOUNTER — Telehealth: Payer: Self-pay | Admitting: *Deleted

## 2019-10-13 NOTE — Telephone Encounter (Signed)
I called pt unable to reach pt.

## 2019-10-15 ENCOUNTER — Telehealth: Payer: Self-pay | Admitting: *Deleted

## 2019-10-15 NOTE — Telephone Encounter (Signed)
Pt need letter for [employer] stated he is not able to drive for 3 months. Pt need asap so he can receive his check. [employer] fax # [phone]

## 2019-10-16 ENCOUNTER — Telehealth: Payer: Self-pay | Admitting: *Deleted

## 2019-10-16 NOTE — Telephone Encounter (Signed)
Pt [employer] form faxed last week. [employer] just need clarification, that pt can not drive for 3 months.

## 2019-10-16 NOTE — Telephone Encounter (Signed)
error 

## 2019-10-20 ENCOUNTER — Telehealth: Payer: Self-pay | Admitting: *Deleted

## 2019-10-20 ENCOUNTER — Encounter: Payer: Self-pay | Admitting: *Deleted

## 2019-10-20 NOTE — Telephone Encounter (Signed)
Pt [employer]  need clarification that pt can not drive or work for 3 months. Pt need it today.

## 2019-10-20 NOTE — Telephone Encounter (Signed)
Pt is stating a letter is needed for [employer] stating he cannot  drive for 3 months due to seizure that took place on 08/23/2019.   Dr. Jannifer Franklin filled out paper work with his information on 10/02/2019 but letter is now being requested as well.  Dr. Jannifer Franklin is out of the office, pt's last visit was with SS, NP on 05/21/2019 will see if she could complete letter for the pt.

## 2019-10-20 NOTE — Telephone Encounter (Signed)
Letter prepared. To SS/NP to sign.

## 2019-10-21 ENCOUNTER — Encounter: Payer: Self-pay | Admitting: *Deleted

## 2019-10-21 NOTE — Telephone Encounter (Signed)
Letter done, signed and to MR.

## 2019-10-21 NOTE — Telephone Encounter (Signed)
Letter to SS/NP

## 2019-10-21 NOTE — Telephone Encounter (Signed)
Pt returned phone call.  

## 2019-10-21 NOTE — Telephone Encounter (Signed)
Letter completed , sent to SS/NP for signature.   To MR.

## 2019-10-21 NOTE — Telephone Encounter (Signed)
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  He cannot go back to work until 3 months, needs to be 100%.  This would be 11-24-19. (3 months from 08-23-19 seizure).  Letter needs to state that he cannot return to work and driving for 3 months from 08-23-19 seizure.

## 2019-10-21 NOTE — Telephone Encounter (Signed)
LMVM for pt to return call about his letter needed for work.

## 2019-10-21 NOTE — Telephone Encounter (Signed)
I believe he has been on short term disability during the time period for his previous seizures. He should really take his medications as prescribed and not miss any doses. [employer] FMLA should have already been completed by Dr. Jannifer Franklin.

## 2019-10-22 ENCOUNTER — Telehealth: Payer: Self-pay | Admitting: *Deleted

## 2019-10-22 NOTE — Telephone Encounter (Signed)
Received Herbie Drape form at my desk, completed to SS/NP for completion and signature.

## 2019-10-23 NOTE — Telephone Encounter (Signed)
Form completed and then signed by SS/NP.  To MR.

## 2019-11-26 ENCOUNTER — Encounter: Payer: Self-pay | Admitting: Neurology

## 2019-11-26 ENCOUNTER — Other Ambulatory Visit: Payer: Self-pay

## 2019-11-26 ENCOUNTER — Ambulatory Visit (INDEPENDENT_AMBULATORY_CARE_PROVIDER_SITE_OTHER): Payer: 59 | Admitting: Neurology

## 2019-11-26 VITALS — BP 137/87 | HR 80 | Ht 75.0 in | Wt 217.0 lb

## 2019-11-26 DIAGNOSIS — R569 Unspecified convulsions: Secondary | ICD-10-CM | POA: Diagnosis not present

## 2019-11-26 DIAGNOSIS — G40309 Generalized idiopathic epilepsy and epileptic syndromes, not intractable, without status epilepticus: Secondary | ICD-10-CM | POA: Diagnosis not present

## 2019-11-26 MED ORDER — DIVALPROEX SODIUM ER 500 MG PO TB24: ORAL_TABLET | ORAL | 1 refills | Status: DC
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
## 2019-11-26 NOTE — Patient Instructions (Signed)
Continue Depakote at current dosing  Take every day at current dosing  Call for seizure activity  See you back in 6 months

## 2019-11-26 NOTE — Progress Notes (Signed)
I have read the note, and I agree with the clinical assessment and plan.  Gary Kennedy   

## 2019-11-27 LAB — CBC WITH DIFFERENTIAL/PLATELET
Basophils Absolute: 0 10*3/uL (ref 0.0–0.2)
Basos: 1 %
EOS (ABSOLUTE): 0.2 10*3/uL (ref 0.0–0.4)
Eos: 2 %
Hematocrit: 39.9 % (ref 37.5–51.0)
Hemoglobin: 13.8 g/dL (ref 13.0–17.7)
Immature Grans (Abs): 0.1 10*3/uL (ref 0.0–0.1)
Immature Granulocytes: 1 %
Lymphocytes Absolute: 2.9 10*3/uL (ref 0.7–3.1)
Lymphs: 35 %
MCH: 32.3 pg (ref 26.6–33.0)
MCHC: 34.6 g/dL (ref 31.5–35.7)
MCV: 93 fL (ref 79–97)
Monocytes Absolute: 0.7 10*3/uL (ref 0.1–0.9)
Monocytes: 8 %
Neutrophils Absolute: 4.6 10*3/uL (ref 1.4–7.0)
Neutrophils: 53 %
Platelets: 186 10*3/uL (ref 150–450)
RBC: 4.27 x10E6/uL (ref 4.14–5.80)
RDW: 12.9 % (ref 11.6–15.4)
WBC: 8.4 10*3/uL (ref 3.4–10.8)

## 2019-11-27 LAB — COMPREHENSIVE METABOLIC PANEL
ALT: 22 IU/L (ref 0–44)
AST: 17 IU/L (ref 0–40)
Albumin/Globulin Ratio: 2.4 — ABNORMAL HIGH (ref 1.2–2.2)
Albumin: 5.1 g/dL — ABNORMAL HIGH (ref 3.8–4.9)
Alkaline Phosphatase: 58 IU/L (ref 44–121)
BUN/Creatinine Ratio: 22 — ABNORMAL HIGH (ref 9–20)
BUN: 21 mg/dL (ref 6–24)
Bilirubin Total: 0.3 mg/dL (ref 0.0–1.2)
CO2: 26 mmol/L (ref 20–29)
Calcium: 9.9 mg/dL (ref 8.7–10.2)
Chloride: 100 mmol/L (ref 96–106)
Creatinine, Ser: 0.96 mg/dL (ref 0.76–1.27)
GFR calc Af Amer: 105 mL/min/{1.73_m2} (ref >59)
GFR calc non Af Amer: 91 mL/min/{1.73_m2} (ref >59)
Globulin, Total: 2.1 g/dL (ref 1.5–4.5)
Glucose: 92 mg/dL (ref 65–99)
Potassium: 4.2 mmol/L (ref 3.5–5.2)
Sodium: 140 mmol/L (ref 134–144)
Total Protein: 7.2 g/dL (ref 6.0–8.5)

## 2019-11-27 LAB — SEDIMENTATION RATE: Sed Rate: 2 mm/hr (ref 0–30)

## 2019-11-27 LAB — VALPROIC ACID LEVEL: Valproic Acid Lvl: 117 ug/mL — ABNORMAL HIGH (ref 50–100)

## 2019-11-27 LAB — C-REACTIVE PROTEIN: CRP: 2 mg/L (ref 0–10)

## 2019-12-01 ENCOUNTER — Telehealth: Payer: Self-pay | Admitting: Neurology

## 2019-12-01 NOTE — Telephone Encounter (Signed)
Glean Salvo, NP  Maurene Capes, CMA Labs show no significant abnormalities, sed rate, CRP are normal- these are non-specific markers of inflammation. Depakote level was 117, continue current dosing.   Left message for patient to call back.

## 2019-12-09 NOTE — Telephone Encounter (Signed)
Left message for patient to call back  

## 2019-12-09 NOTE — Telephone Encounter (Signed)
Pt has responded to message from Cherina,CMA.  Please call pt to provide results.

## 2020-01-13 ENCOUNTER — Telehealth: Payer: Self-pay | Admitting: Neurology

## 2020-01-13 NOTE — Telephone Encounter (Signed)
Pt called to discuss lab results. Would like a call from the nurse.

## 2020-01-14 NOTE — Telephone Encounter (Signed)
Spoke with patient. He is aware of results. Verbalized understanding. Nothing further needed at time of call.  

## 2020-01-27 DIAGNOSIS — Z0289 Encounter for other administrative examinations: Secondary | ICD-10-CM

## 2020-02-02 ENCOUNTER — Telehealth: Payer: Self-pay | Admitting: *Deleted

## 2020-02-02 NOTE — Telephone Encounter (Signed)
Pt form faxed to [facility] on 02/02/20

## 2020-06-10 ENCOUNTER — Ambulatory Visit: Payer: 59 | Admitting: Neurology

## 2020-08-10 ENCOUNTER — Other Ambulatory Visit: Payer: Self-pay | Admitting: Neurology

## 2020-10-29 ENCOUNTER — Telehealth: Payer: Self-pay | Admitting: Neurology

## 2020-10-29 ENCOUNTER — Encounter: Payer: Self-pay | Admitting: Neurology

## 2020-10-29 ENCOUNTER — Ambulatory Visit: Payer: 59 | Admitting: Neurology

## 2020-10-29 NOTE — Telephone Encounter (Signed)
This is the second no-show for a revisit for this patient.  He did not show for a visit on 10 June 2020.

## 2020-11-22 ENCOUNTER — Ambulatory Visit (INDEPENDENT_AMBULATORY_CARE_PROVIDER_SITE_OTHER): Payer: 59 | Admitting: Neurology

## 2020-11-22 ENCOUNTER — Encounter: Payer: Self-pay | Admitting: Neurology

## 2020-11-22 VITALS — BP 134/82 | HR 73 | Ht 75.0 in | Wt 209.5 lb

## 2020-11-22 DIAGNOSIS — G4733 Obstructive sleep apnea (adult) (pediatric): Secondary | ICD-10-CM

## 2020-11-22 DIAGNOSIS — Z5181 Encounter for therapeutic drug level monitoring: Secondary | ICD-10-CM | POA: Diagnosis not present

## 2020-11-22 MED ORDER — DIVALPROEX SODIUM ER 500 MG PO TB24: ORAL_TABLET | ORAL | 1 refills | Status: DC
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
## 2020-11-23 ENCOUNTER — Telehealth: Payer: Self-pay

## 2020-11-23 LAB — COMPREHENSIVE METABOLIC PANEL
ALT: 17 IU/L (ref 0–44)
AST: 19 IU/L (ref 0–40)
Albumin/Globulin Ratio: 2.6 — ABNORMAL HIGH (ref 1.2–2.2)
Albumin: 4.7 g/dL (ref 3.8–4.9)
Alkaline Phosphatase: 50 IU/L (ref 44–121)
BUN/Creatinine Ratio: 17 (ref 9–20)
BUN: 16 mg/dL (ref 6–24)
Bilirubin Total: <0.2 mg/dL (ref 0.0–1.2)
CO2: 22 mmol/L (ref 20–29)
Calcium: 10 mg/dL (ref 8.7–10.2)
Chloride: 104 mmol/L (ref 96–106)
Creatinine, Ser: 0.92 mg/dL (ref 0.76–1.27)
Globulin, Total: 1.8 g/dL (ref 1.5–4.5)
Glucose: 104 mg/dL — ABNORMAL HIGH (ref 65–99)
Potassium: 4.8 mmol/L (ref 3.5–5.2)
Sodium: 142 mmol/L (ref 134–144)
Total Protein: 6.5 g/dL (ref 6.0–8.5)
eGFR: 100 mL/min/{1.73_m2} (ref >59)

## 2020-11-23 LAB — CBC WITH DIFFERENTIAL/PLATELET
Basophils Absolute: 0.1 10*3/uL (ref 0.0–0.2)
Basos: 1 %
EOS (ABSOLUTE): 0.2 10*3/uL (ref 0.0–0.4)
Eos: 3 %
Hematocrit: 42.9 % (ref 37.5–51.0)
Hemoglobin: 14.5 g/dL (ref 13.0–17.7)
Immature Grans (Abs): 0 10*3/uL (ref 0.0–0.1)
Immature Granulocytes: 1 %
Lymphocytes Absolute: 2.4 10*3/uL (ref 0.7–3.1)
Lymphs: 39 %
MCH: 32 pg (ref 26.6–33.0)
MCHC: 33.8 g/dL (ref 31.5–35.7)
MCV: 95 fL (ref 79–97)
Monocytes Absolute: 0.6 10*3/uL (ref 0.1–0.9)
Monocytes: 10 %
Neutrophils Absolute: 2.9 10*3/uL (ref 1.4–7.0)
Neutrophils: 46 %
Platelets: 184 10*3/uL (ref 150–450)
RBC: 4.53 x10E6/uL (ref 4.14–5.80)
RDW: 13.2 % (ref 11.6–15.4)
WBC: 6.2 10*3/uL (ref 3.4–10.8)

## 2020-11-23 LAB — VALPROIC ACID LEVEL: Valproic Acid Lvl: 114 ug/mL — ABNORMAL HIGH (ref 50–100)

## 2020-11-23 NOTE — Telephone Encounter (Signed)
-----   Message from York Spaniel, MD sent at 11/23/2020  7:57 AM EDT ----- Blood work is unremarkable, excellent valproic acid levels seen, no change in medical therapy is recommended. Please call the patient. ----- Message ----- From: Nell Range Lab Results In Sent: 11/23/2020   7:37 AM EDT To: York Spaniel, MD

## 2020-11-23 NOTE — Telephone Encounter (Signed)
I called pt. No answer, left a message asking pt to call me back.  If pt calls back TE staff can relay results.

## 2020-11-27 ENCOUNTER — Other Ambulatory Visit: Payer: Self-pay | Admitting: Neurology

## 2020-12-07 ENCOUNTER — Ambulatory Visit: Payer: 59 | Admitting: Neurology

## 2021-03-08 DIAGNOSIS — Z0289 Encounter for other administrative examinations: Secondary | ICD-10-CM

## 2021-05-17 ENCOUNTER — Telehealth: Payer: Self-pay | Admitting: Neurology

## 2021-05-17 MED ORDER — DIVALPROEX SODIUM ER 500 MG PO TB24: ORAL_TABLET | ORAL | 1 refills | Status: DC

## 2021-05-17 NOTE — Telephone Encounter (Signed)
Pending appt w/ Sarah on 11/22/21. Refills sent to pharmacy to avoid disruption to care. ?

## 2021-05-17 NOTE — Telephone Encounter (Signed)
Pt is requesting a refill for divalproex (DEPAKOTE ER) 500 MG 24 hr tablet . ? ?Pharmacy: [facility]  ?Pt has scheduled his f/u ?

## 2021-10-15 ENCOUNTER — Other Ambulatory Visit: Payer: Self-pay | Admitting: Neurology

## 2021-10-17 NOTE — Telephone Encounter (Signed)
Rx refilled.

## 2021-11-17 NOTE — Progress Notes (Unsigned)
Patient: Gary Kennedy Date of Birth: 10/12/1970  Reason for Visit: Follow up for seizures  History from: Patient Primary Neurologist: Willis/Dohmeier   ASSESSMENT AND PLAN 51 y.o. year old male   1.  History of seizures  -Check labs today -Continue current dose of Depakote -Dr. Anne Hahn referred for sleep consult, will check with him of this via my chart message when labs result, he had sleep study in 2014 showing moderate OSA.  -Call for any seizures, follow-up in 1 year  HISTORY OF PRESENT ILLNESS: Today 11/22/21 Gary Kennedy is here today for follow-up. Remains on Depakote. Last seizure was June 2021, when he forgot his medications.  Tremor is much improved with lower dose medicine.  He still has some fine tremor.  He works full-time as a Merchandiser, retail at [employer].  He has had some muscle cramps over the last year or so.  He was referred for sleep consult after last visit.  HISTORY  11/22/2020 Dr. Anne Hahn: Gary Kennedy is a 51 year old right-handed white male with a history of seizures that have been well controlled on Depakote.  His last seizure occurred on 23 August 2019 when he went on a camping trip and forgot his medications, he was without his Depakote for 2 days or so.  The patient continues to work, he operates a Librarian, academic.  He has sleep apnea on CPAP and has been seen through this office previously but has not been seen in several years.  He needs a sleep physician to help manage his durable medical equipment.  He reduced the Depakote dosing on his own as he was taking 1000 mg in the morning and 1500 mg in the evening and this resulted in significant tremor.  He has cut back to 1000 mg twice daily and has done well with this without recurrent seizures.  The patient returns for an evaluation.  REVIEW OF SYSTEMS: Out of a complete 14 system review of symptoms, the patient complains only of the following symptoms, and all other reviewed systems are negative.  See HPI  ALLERGIES: No  Known Allergies  HOME MEDICATIONS: Outpatient Medications Prior to Visit  Medication Sig Dispense Refill   divalproex (DEPAKOTE ER) 500 MG 24 hr tablet TAKE 2 TABLETS BY MOUTH IN THE  MORNING AND 2 TABLETS BY MOUTH  IN THE EVENING 360 tablet 0   pravastatin (PRAVACHOL) 20 MG tablet Take 20 mg by mouth daily as needed.     No facility-administered medications prior to visit.    PAST MEDICAL HISTORY: Past Medical History:  Diagnosis Date   Seizure (HCC)    Seizures (HCC)     PAST SURGICAL HISTORY: Past Surgical History:  Procedure Laterality Date   HERNIA REPAIR     TIBIA IM NAIL INSERTION Right 07/10/2018   Procedure: INTRAMEDULLARY (IM) NAIL TIBIAL;  Surgeon: Roby Lofts, MD;  Location: [facility] OR;  Service: Orthopedics;  Laterality: Right;    FAMILY HISTORY: Family History  Adopted: Yes  Family history unknown: Yes    SOCIAL HISTORY: Social History   Socioeconomic History   Marital status: Single    Spouse name: Not on file   Number of children: Not on file   Years of education: Not on file   Highest education level: Not on file  Occupational History   Not on file  Tobacco Use   Smoking status: Never   Smokeless tobacco: Never  Vaping Use   Vaping Use: Never used  Substance and Sexual Activity  Alcohol use: Yes    Comment: occ beer once in a while. 2 cups of coffee daily.    Drug use: Never   Sexual activity: Yes  Other Topics Concern   Not on file  Social History Narrative   ** Merged History Encounter **       Social Determinants of Health   Financial Resource Strain: Not on file  Food Insecurity: Not on file  Transportation Needs: Not on file  Physical Activity: Not on file  Stress: Not on file  Social Connections: Not on file  Intimate Partner Violence: Not on file    PHYSICAL EXAM  Vitals:   11/22/21 1302  BP: (!) 142/76  Pulse: 78  Weight: 220 lb (99.8 kg)  Height: 6\' 3"  (1.905 m)   Body mass index is 27.5 kg/m.  Generalized:  Well developed, in no acute distress  Neurological examination  Mentation: Alert oriented to time, place, history taking. Follows all commands speech and language fluent Cranial nerve II-XII: Pupils were equal round reactive to light. Extraocular movements were full, visual field were full on confrontational test. Facial sensation and strength were normal. Uvula tongue midline. Head turning and shoulder shrug  were normal and symmetric. Motor: The motor testing reveals 5 over 5 strength of all 4 extremities. Good symmetric motor tone is noted throughout.  Sensory: Sensory testing is intact to soft touch on all 4 extremities. No evidence of extinction is noted.  Coordination: Cerebellar testing reveals good finger-nose-finger and heel-to-shin bilaterally.  Gait and station: Gait is normal.  Reflexes: Deep tendon reflexes are symmetric and normal bilaterally.   DIAGNOSTIC DATA (LABS, IMAGING, TESTING) - I reviewed patient records, labs, notes, testing and imaging myself where available.  Lab Results  Component Value Date   WBC 6.2 11/22/2020   HGB 14.5 11/22/2020   HCT 42.9 11/22/2020   MCV 95 11/22/2020   PLT 184 11/22/2020      Component Value Date/Time   NA 142 11/22/2020 0904   K 4.8 11/22/2020 0904   CL 104 11/22/2020 0904   CO2 22 11/22/2020 0904   GLUCOSE 104 (H) 11/22/2020 0904   GLUCOSE 183 (H) 07/10/2018 0621   BUN 16 11/22/2020 0904   CREATININE 0.92 11/22/2020 0904   CALCIUM 10.0 11/22/2020 0904   PROT 6.5 11/22/2020 0904   ALBUMIN 4.7 11/22/2020 0904   AST 19 11/22/2020 0904   ALT 17 11/22/2020 0904   ALKPHOS 50 11/22/2020 0904   BILITOT <0.2 11/22/2020 0904   GFRNONAA 91 11/26/2019 1613   GFRAA 105 11/26/2019 1613   No results found for: "CHOL", "HDL", "LDLCALC", "LDLDIRECT", "TRIG", "CHOLHDL" No results found for: "HGBA1C" No results found for: "VITAMINB12" No results found for: "TSH"  11/28/2019, AGNP-C, DNP 11/22/2021, 1:35 PM [facility] [address] [place], [place] [phone]

## 2021-11-22 ENCOUNTER — Ambulatory Visit (INDEPENDENT_AMBULATORY_CARE_PROVIDER_SITE_OTHER): Payer: 59 | Admitting: Neurology

## 2021-11-22 ENCOUNTER — Encounter: Payer: Self-pay | Admitting: Neurology

## 2021-11-22 VITALS — BP 142/76 | HR 78 | Ht 75.0 in | Wt 220.0 lb

## 2021-11-22 DIAGNOSIS — R569 Unspecified convulsions: Secondary | ICD-10-CM | POA: Diagnosis not present

## 2021-11-22 MED ORDER — DIVALPROEX SODIUM ER 500 MG PO TB24: ORAL_TABLET | ORAL | 4 refills | Status: DC

## 2021-11-22 MED ORDER — DIVALPROEX SODIUM ER 500 MG PO TB24: ORAL_TABLET | ORAL | 0 refills | Status: DC

## 2021-11-22 NOTE — Patient Instructions (Signed)
To see you today, I will refill your Depakote, we will check labs, please call me if you have any seizures or concerns, otherwise see back in 1 year :)

## 2021-11-23 ENCOUNTER — Telehealth: Payer: Self-pay | Admitting: Neurology

## 2021-11-23 LAB — COMPREHENSIVE METABOLIC PANEL
ALT: 24 IU/L (ref 0–44)
AST: 24 IU/L (ref 0–40)
Albumin/Globulin Ratio: 2.3 — ABNORMAL HIGH (ref 1.2–2.2)
Albumin: 4.8 g/dL (ref 3.8–4.9)
Alkaline Phosphatase: 54 IU/L (ref 44–121)
BUN/Creatinine Ratio: 17 (ref 9–20)
BUN: 17 mg/dL (ref 6–24)
Bilirubin Total: <0.2 mg/dL (ref 0.0–1.2)
CO2: 24 mmol/L (ref 20–29)
Calcium: 9.5 mg/dL (ref 8.7–10.2)
Chloride: 101 mmol/L (ref 96–106)
Creatinine, Ser: 1 mg/dL (ref 0.76–1.27)
Globulin, Total: 2.1 g/dL (ref 1.5–4.5)
Glucose: 160 mg/dL — ABNORMAL HIGH (ref 70–99)
Potassium: 4.4 mmol/L (ref 3.5–5.2)
Sodium: 141 mmol/L (ref 134–144)
Total Protein: 6.9 g/dL (ref 6.0–8.5)
eGFR: 90 mL/min/{1.73_m2} (ref >59)

## 2021-11-23 LAB — CBC WITH DIFFERENTIAL/PLATELET
Basophils Absolute: 0 10*3/uL (ref 0.0–0.2)
Basos: 1 %
EOS (ABSOLUTE): 0.1 10*3/uL (ref 0.0–0.4)
Eos: 2 %
Hematocrit: 40.5 % (ref 37.5–51.0)
Hemoglobin: 13.7 g/dL (ref 13.0–17.7)
Immature Grans (Abs): 0.1 10*3/uL (ref 0.0–0.1)
Immature Granulocytes: 2 %
Lymphocytes Absolute: 2.2 10*3/uL (ref 0.7–3.1)
Lymphs: 36 %
MCH: 31.9 pg (ref 26.6–33.0)
MCHC: 33.8 g/dL (ref 31.5–35.7)
MCV: 94 fL (ref 79–97)
Monocytes Absolute: 0.4 10*3/uL (ref 0.1–0.9)
Monocytes: 7 %
Neutrophils Absolute: 3.2 10*3/uL (ref 1.4–7.0)
Neutrophils: 52 %
Platelets: 196 10*3/uL (ref 150–450)
RBC: 4.29 x10E6/uL (ref 4.14–5.80)
RDW: 12.8 % (ref 11.6–15.4)
WBC: 6.1 10*3/uL (ref 3.4–10.8)

## 2021-11-23 LAB — VALPROIC ACID LEVEL: Valproic Acid Lvl: 90 ug/mL (ref 50–100)

## 2021-11-23 NOTE — Telephone Encounter (Signed)
Contacted pt, stated he had the sleep study 2014. He has been getting supplies but isnt sure who managing it or sending orders to DME. He also wasn't sure who DME was. Pt will bring CPAP in tomorrow for DL

## 2021-11-23 NOTE — Telephone Encounter (Signed)
Error

## 2021-11-23 NOTE — Telephone Encounter (Signed)
I called the patient, blood work shows no significant abnormalities, random glucose was elevated at 160 after eating lunch, Depakote level is therapeutic at 90.  He indicates he is using his CPAP nightly.  I tried to pull CPAP download, there is no recent data.  He will need to bring his machine by for card download.  I will then send an order for supplies.

## 2022-04-10 ENCOUNTER — Telehealth: Payer: Self-pay | Admitting: Neurology

## 2022-04-10 MED ORDER — DIVALPROEX SODIUM ER 500 MG PO TB24: ORAL_TABLET | ORAL | 0 refills | Status: DC

## 2022-04-10 NOTE — Telephone Encounter (Signed)
Pt is calling. Stated his insurance changed and he needs a new prescription for divalproex (DEPAKOTE ER) 500 MG 24 hr tablet a 90 day supply sent to [facility].

## 2022-04-10 NOTE — Telephone Encounter (Signed)
Will send refill.

## 2022-05-29 ENCOUNTER — Other Ambulatory Visit: Payer: Self-pay

## 2022-05-29 ENCOUNTER — Telehealth: Payer: Self-pay | Admitting: Neurology

## 2022-05-29 ENCOUNTER — Other Ambulatory Visit: Payer: Self-pay | Admitting: Neurology

## 2022-05-29 MED ORDER — DIVALPROEX SODIUM ER 500 MG PO TB24: ORAL_TABLET | ORAL | 0 refills | Status: DC

## 2022-05-29 NOTE — Telephone Encounter (Signed)
Pt is requesting a refill for divalproex (DEPAKOTE ER) 500 MG 24 hr tablet .  Pharmacy: [facility] states he took his last  on Sunday evening.

## 2022-06-07 NOTE — Telephone Encounter (Signed)
Pt called wanting to know if this request has been fulfilled for a 3 month quantity

## 2022-06-19 ENCOUNTER — Other Ambulatory Visit: Payer: Self-pay

## 2022-06-19 MED ORDER — DIVALPROEX SODIUM ER 500 MG PO TB24: ORAL_TABLET | ORAL | 0 refills | Status: DC

## 2022-06-19 NOTE — Telephone Encounter (Signed)
Requested Prescriptions   Pending Prescriptions Disp Refills   divalproex (DEPAKOTE ER) 500 MG 24 hr tablet 120 tablet 0    Sig: Take 2 tablets twice a day   Received fax from [facility] that pt needs refill.  Last seen by slack 11/22/21, upcoming appt 11/23/22  Routing to provider to fill  Dispenses   Dispensed Days Supply Quantity Provider Pharmacy  divalproex ER 500 mg tablet,extended release 24 hr 05/29/2022 30 120 each Glean Salvo, NP [facility]...  DIVALPROEX 500MG  ER TAB 04/11/2022 30 120 tablet Glean Salvo, NP [facility]...  divalproex ER 500 mg tablet,extended release 24 hr 01/03/2022 8 32 each Glean Salvo, NP [facility]...  divalproex ER 500 mg tablet,extended release 24 hr 11/22/2021 30 120 each Glean Salvo, NP [facility]...  DIVALPROEX SODIUM ER  500 MG TB24 08/23/2021 90 360 tablet Glean Salvo, NP [facility], [place]

## 2022-06-19 NOTE — Telephone Encounter (Signed)
Requested Prescriptions   Pending Prescriptions Disp Refills   divalproex (DEPAKOTE ER) 500 MG 24 hr tablet 120 tablet 0    Sig: Take 2 tablets twice a day   Pt last seen by slack 11/22/21, upcoming appt 11/23/22. Routing to provider to fill Dispenses   Dispensed Days Supply Quantity Provider Pharmacy  DIVALPROEX 500MG  ER TAB 04/11/2022 30 120 tablet Glean Salvo, NP [facility]  divalproex ER 500 mg tablet,extended release 24 hr 01/03/2022 8 32 each Glean Salvo, NP [facility]...  divalproex ER 500 mg tablet,extended release 24 hr 11/22/2021 30 120 each Glean Salvo, NP [facility]...  DIVALPROEX SODIUM ER  500 MG TB24 08/23/2021 90 360 tablet Glean Salvo, NP [facility], [place]

## 2022-06-20 ENCOUNTER — Telehealth: Payer: Self-pay | Admitting: Neurology

## 2022-06-20 NOTE — Telephone Encounter (Signed)
Unable to leave msg that the med was sent yesterday     

## 2022-06-20 NOTE — Telephone Encounter (Signed)
Pt called stating he is down to 5 days of his divalproex (DEPAKOTE ER) 500 MG 24 hr tablet and is needing a refill request sent in to the [facility]

## 2022-06-21 NOTE — Telephone Encounter (Signed)
Closed for no contact

## 2022-06-21 NOTE — Telephone Encounter (Signed)
Unable to leave msg that the med was sent yesterday

## 2022-07-25 ENCOUNTER — Other Ambulatory Visit: Payer: Self-pay | Admitting: Neurology

## 2022-07-25 MED ORDER — DIVALPROEX SODIUM ER 500 MG PO TB24: ORAL_TABLET | ORAL | 0 refills | Status: DC

## 2022-07-25 NOTE — Telephone Encounter (Signed)
Requested Prescriptions   Pending Prescriptions Disp Refills   divalproex (DEPAKOTE ER) 500 MG 24 hr tablet 120 tablet 0    Sig: Take 2 tablets twice a day   Last seen 11/22/21, next appt scheduled 9/12//24. Routing to provider to fill.  Dispenses   Dispensed Days Supply Quantity Provider Pharmacy  divalproex ER 500 mg tablet,extended release 24 hr 05/29/2022 30 120 each Glean Salvo, NP [facility]...  DIVALPROEX 500MG  ER TAB 04/11/2022 30 120 tablet Glean Salvo, NP [facility]  divalproex ER 500 mg tablet,extended release 24 hr 01/03/2022 8 32 each Glean Salvo, NP [facility]...  divalproex ER 500 mg tablet,extended release 24 hr 11/22/2021 30 120 each Glean Salvo, NP [facility]...  DIVALPROEX SODIUM ER  500 MG TB24 08/23/2021 90 360 tablet Glean Salvo, NP [facility], [place]

## 2022-07-25 NOTE — Telephone Encounter (Signed)
Pt requesting a refill on divalproex (DEPAKOTE ER) 500 MG 24 hr tablet. Should be sent to [facility], [place] - [place], [place] - [address]

## 2022-08-30 ENCOUNTER — Other Ambulatory Visit: Payer: Self-pay

## 2022-08-30 MED ORDER — DIVALPROEX SODIUM ER 500 MG PO TB24: ORAL_TABLET | ORAL | 0 refills | Status: DC

## 2022-09-28 ENCOUNTER — Other Ambulatory Visit: Payer: Self-pay

## 2022-09-28 MED ORDER — DIVALPROEX SODIUM ER 500 MG PO TB24: ORAL_TABLET | ORAL | 0 refills | Status: DC

## 2022-09-28 NOTE — Telephone Encounter (Signed)
Requested Prescriptions   Pending Prescriptions Disp Refills   divalproex (DEPAKOTE ER) 500 MG 24 hr tablet 120 tablet 0    Sig: Take 2 tablets twice a day   Dispenses   Dispensed Days Supply Quantity Provider Pharmacy  divalproex ER 500 mg tablet,extended release 24 hr 09/06/2022 30 120 each Glean Salvo, NP [facility]...  divalproex ER 500 mg tablet,extended release 24 hr 07/25/2022 30 120 each Glean Salvo, NP [facility]...  divalproex ER 500 mg tablet,extended release 24 hr 05/29/2022 30 120 each Glean Salvo, NP [facility]...  divalproex ER 500 mg tablet,extended release 24 hr 01/03/2022 8 32 each Glean Salvo, NP [facility]...  divalproex ER 500 mg tablet,extended release 24 hr 11/22/2021 30 120 each Glean Salvo, NP [facility].[place] KitchenMarland Kitchen

## 2022-10-02 ENCOUNTER — Other Ambulatory Visit: Payer: Self-pay

## 2022-10-02 ENCOUNTER — Telehealth: Payer: Self-pay | Admitting: Neurology

## 2022-10-02 MED ORDER — DIVALPROEX SODIUM ER 500 MG PO TB24: ORAL_TABLET | ORAL | 0 refills | Status: DC

## 2022-10-02 NOTE — Telephone Encounter (Signed)
Requested Prescriptions   Pending Prescriptions Disp Refills   divalproex (DEPAKOTE ER) 500 MG 24 hr tablet 360 tablet 0    Sig: Take 2 tablets twice a day   Dispenses    Dispensed Days Supply Quantity Provider Pharmacy  divalproex ER 500 mg tablet,extended release 24 hr 09/06/2022 30 120 each Glean Salvo, NP [facility]...  divalproex ER 500 mg tablet,extended release 24 hr 07/25/2022 30 120 each Glean Salvo, NP [facility]...  divalproex ER 500 mg tablet,extended release 24 hr 05/29/2022 30 120 each Glean Salvo, NP [facility]...  divalproex ER 500 mg tablet,extended release 24 hr 01/03/2022 8 32 each Glean Salvo, NP [facility]...  divalproex ER 500 mg tablet,extended release 24 hr 11/22/2021 30 120 each Glean Salvo, NP [facility].[place] KitchenMarland Kitchen

## 2022-10-02 NOTE — Telephone Encounter (Signed)
Phone room: Please notify pt that refill was sent to: [facility]) - [place], [place] - [address] [facility] [address] [place] [address], [place] [phone]

## 2022-10-02 NOTE — Telephone Encounter (Signed)
Pt called needing a refill request for his divalproex (DEPAKOTE ER) 500 MG 24 hr tablet sent in to the [facility]

## 2022-10-19 ENCOUNTER — Telehealth: Payer: Self-pay

## 2022-10-19 NOTE — Telephone Encounter (Signed)
Faxed refill authorization for Depakote ER [facility] on 10/18/2022.

## 2022-11-23 ENCOUNTER — Ambulatory Visit: Payer: Self-pay | Admitting: Neurology

## 2022-11-23 NOTE — Progress Notes (Deleted)
Patient: Gary Kennedy Date of Birth: 25-Aug-1970  Reason for Visit: Follow up for seizures  History from: Patient Primary Neurologist: Willis/Dohmeier   ASSESSMENT AND PLAN 52 y.o. year old male   1.  History of seizures  -Check labs today -Continue current dose of Depakote -Dr. Anne Hahn referred for sleep consult, will check with him of this via my chart message when labs result, he had sleep study in 2014 showing moderate OSA.  -Call for any seizures, follow-up in 1 year  HISTORY OF PRESENT ILLNESS: Today 11/23/22   11/22/21 SS: Gary Kennedy is here today for follow-up. Remains on Depakote. Last seizure was June 2021, when he forgot his medications.  Tremor is much improved with lower dose medicine.  He still has some fine tremor.  He works full-time as a Merchandiser, retail at [employer].  He has had some muscle cramps over the last year or so.  He was referred for sleep consult after last visit.  HISTORY  11/22/2020 Dr. Anne Hahn: Mr. Gary Kennedy is a 52 year old right-handed white male with a history of seizures that have been well controlled on Depakote.  His last seizure occurred on 23 August 2019 when he went on a camping trip and forgot his medications, he was without his Depakote for 2 days or so.  The patient continues to work, he operates a Librarian, academic.  He has sleep apnea on CPAP and has been seen through this office previously but has not been seen in several years.  He needs a sleep physician to help manage his durable medical equipment.  He reduced the Depakote dosing on his own as he was taking 1000 mg in the morning and 1500 mg in the evening and this resulted in significant tremor.  He has cut back to 1000 mg twice daily and has done well with this without recurrent seizures.  The patient returns for an evaluation.  REVIEW OF SYSTEMS: Out of a complete 14 system review of symptoms, the patient complains only of the following symptoms, and all other reviewed systems are negative.  See  HPI  ALLERGIES: No Known Allergies  HOME MEDICATIONS: Outpatient Medications Prior to Visit  Medication Sig Dispense Refill   divalproex (DEPAKOTE ER) 500 MG 24 hr tablet Take 2 tablets twice a day 360 tablet 0   No facility-administered medications prior to visit.    PAST MEDICAL HISTORY: Past Medical History:  Diagnosis Date   Seizure (HCC)    Seizures (HCC)     PAST SURGICAL HISTORY: Past Surgical History:  Procedure Laterality Date   HERNIA REPAIR     TIBIA IM NAIL INSERTION Right 07/10/2018   Procedure: INTRAMEDULLARY (IM) NAIL TIBIAL;  Surgeon: Roby Lofts, MD;  Location: [facility] OR;  Service: Orthopedics;  Laterality: Right;    FAMILY HISTORY: Family History  Adopted: Yes  Family history unknown: Yes    SOCIAL HISTORY: Social History   Socioeconomic History   Marital status: Single    Spouse name: Not on file   Number of children: Not on file   Years of education: Not on file   Highest education level: Not on file  Occupational History   Not on file  Tobacco Use   Smoking status: Never   Smokeless tobacco: Never  Vaping Use   Vaping status: Never Used  Substance and Sexual Activity   Alcohol use: Yes    Comment: occ beer once in a while. 2 cups of coffee daily.    Drug use: Never  Sexual activity: Yes  Other Topics Concern   Not on file  Social History Narrative   ** Merged History Encounter **       Social Determinants of Health   Financial Resource Strain: Low Risk  (06/09/2021)   Received from [facility]   Overall Financial Resource Strain (CARDIA)    Difficulty of Paying Living Expenses: Not very hard  Food Insecurity: No Food Insecurity (06/09/2021)   [facility] from [facility]   Hunger Vital Sign    Worried About Running Out of Food in the Last Year: Never true    Ran Out of Food in the Last Year: Never true  Transportation Needs: Not on file  Physical Activity: Sufficiently Active (06/09/2021)    Received from [facility]   Exercise Vital Sign    Days of Exercise per Week: 5 days    Minutes of Exercise per Session: 30 min  Stress: No Stress Concern Present (06/09/2021)   Received from [facility], [facility] - Occupational Stress Questionnaire    Feeling of Stress : Not at all  Social Connections: Unknown (06/12/2022)   Received from [facility], [facility]   Social Network    Social Network: Not on file  Intimate Partner Violence: Unknown (06/12/2022)   Received from [facility]   HITS    Physically Hurt: Not on file    Insult or Talk Down To: Not on file    Threaten Physical Harm: Not on file    Scream or Curse: Not on file    PHYSICAL EXAM  There were no vitals filed for this visit.  There is no height or weight on file to calculate BMI.  Generalized: Well developed, in no acute distress  Neurological examination  Mentation: Alert oriented to time, place, history taking. Follows all commands speech and language fluent Cranial nerve II-XII: Pupils were equal round reactive to light. Extraocular movements were full, visual field were full on confrontational test. Facial sensation and strength were normal. Uvula tongue midline. Head turning and shoulder shrug  were normal and symmetric. Motor: The motor testing reveals 5 over 5 strength of all 4 extremities. Good symmetric motor tone is noted throughout.  Sensory: Sensory testing is intact to soft touch on all 4 extremities. No evidence of extinction is noted.  Coordination: Cerebellar testing reveals good finger-nose-finger and heel-to-shin bilaterally.  Gait and station: Gait is normal.  Reflexes: Deep tendon reflexes are symmetric and normal bilaterally.   DIAGNOSTIC DATA (LABS, IMAGING, TESTING) - I reviewed patient records, labs, notes, testing and imaging myself where available.  Lab Results  Component Value Date   WBC 6.1  11/22/2021   HGB 13.7 11/22/2021   HCT 40.5 11/22/2021   MCV 94 11/22/2021   PLT 196 11/22/2021      Component Value Date/Time   NA 141 11/22/2021 1330   K 4.4 11/22/2021 1330   CL 101 11/22/2021 1330   CO2 24 11/22/2021 1330   GLUCOSE 160 (H) 11/22/2021 1330   GLUCOSE 183 (H) 07/10/2018 0621   BUN 17 11/22/2021 1330   CREATININE 1.00 11/22/2021 1330   CALCIUM 9.5 11/22/2021 1330   PROT 6.9 11/22/2021 1330   ALBUMIN 4.8 11/22/2021 1330   AST 24 11/22/2021 1330   ALT 24 11/22/2021 1330   ALKPHOS 54 11/22/2021 1330   BILITOT <0.2 11/22/2021 1330   GFRNONAA 91 11/26/2019 1613   GFRAA 105 11/26/2019  1613   No results found for: "CHOL", "HDL", "LDLCALC", "LDLDIRECT", "TRIG", "CHOLHDL" No results found for: "HGBA1C" No results found for: "VITAMINB12" No results found for: "TSH"  Margie Ege, AGNP-C, DNP 11/23/2022, 5:45 AM [facility] [address] [place], [place] [address] [phone]

## 2022-12-04 ENCOUNTER — Other Ambulatory Visit: Payer: Self-pay | Admitting: Neurology
